# Patient Record
Sex: Male | Born: 1990 | Race: White | Hispanic: No | Marital: Single | Smoking: Current every day smoker
Health system: Southern US, Community
[De-identification: ages and names within clinical notes are randomized; demographics above are authoritative.]

## PROBLEM LIST (undated history)

## (undated) ENCOUNTER — Emergency Department (HOSPITAL_COMMUNITY): Admission: EM | Payer: BLUE CROSS/BLUE SHIELD | Source: Home / Self Care

## (undated) DIAGNOSIS — K5900 Constipation, unspecified: Secondary | ICD-10-CM

## (undated) DIAGNOSIS — F988 Other specified behavioral and emotional disorders with onset usually occurring in childhood and adolescence: Secondary | ICD-10-CM

## (undated) DIAGNOSIS — K219 Gastro-esophageal reflux disease without esophagitis: Secondary | ICD-10-CM

## (undated) HISTORY — PX: TYMPANOSTOMY TUBE PLACEMENT: SHX32

## (undated) HISTORY — PX: HAND SURGERY: SHX662

## (undated) HISTORY — DX: Constipation, unspecified: K59.00

---
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
## 2012-05-19 IMAGING — CT CT ABD-PELV W/ CM
1 of 2 series · 15 of 32 positions shown, 19 images · IV contrast (APPLIED)
Comparison: Plain radiographs 09/15/2011

CLINICAL DATA: Umbilical abdominal pain with diarrhea.

CT ABDOMEN AND PELVIS WITH CONTRAST
TECHNIQUE: Multidetector CT imaging of the abdomen and pelvis was
performed following the standard protocol during bolus
administration of intravenous contrast.
Contrast: 100mL OMNIPAQUE IOHEXOL 300 MG/ML IV SOLN

[Series 2: abd/pel with · axial · 0.77mm/px · z∈[-404,+6]mm · 15 of 90 slices shown, 19 images]
[im 4/90  soft-tissue]
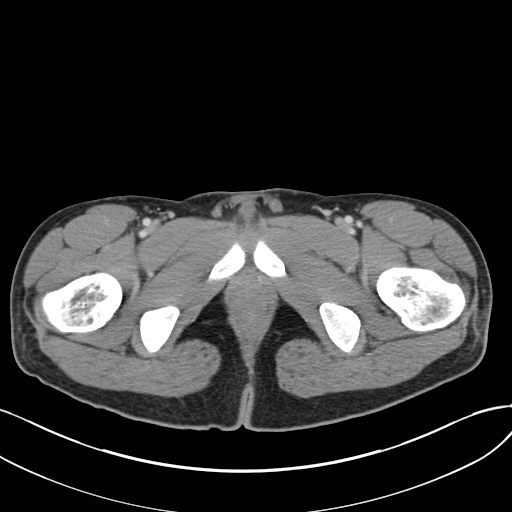
[im 4/90  bone]
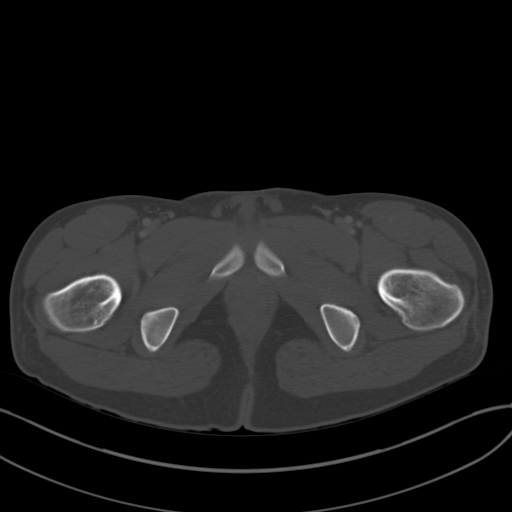
[im 12/90  soft-tissue]
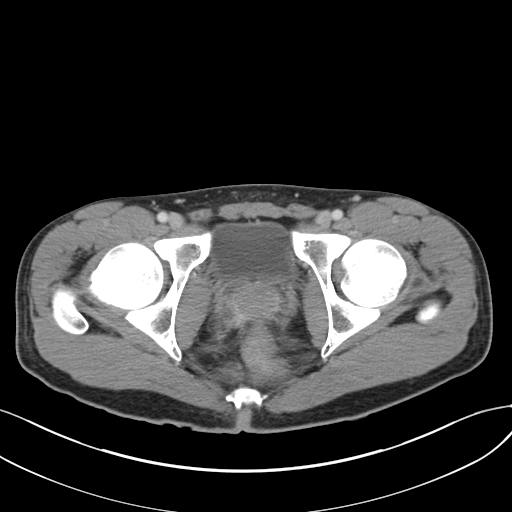
[im 20/90  soft-tissue]
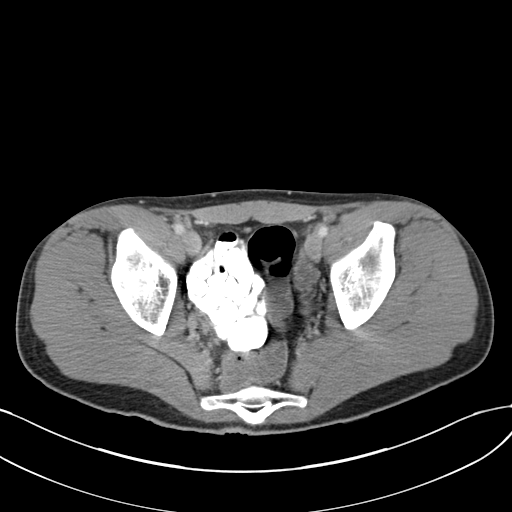
[im 24/90  soft-tissue]
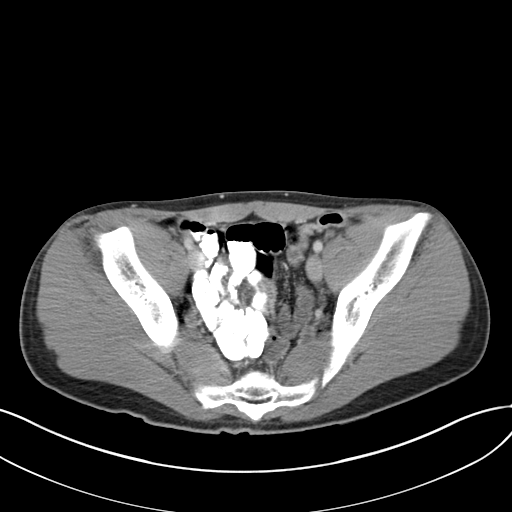
[im 31/90  soft-tissue]
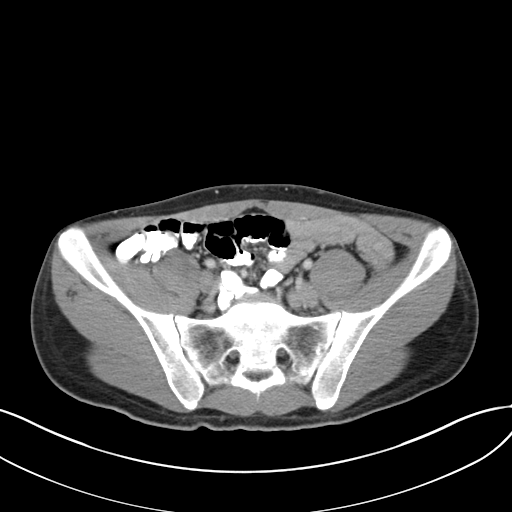
[im 39/90  soft-tissue]
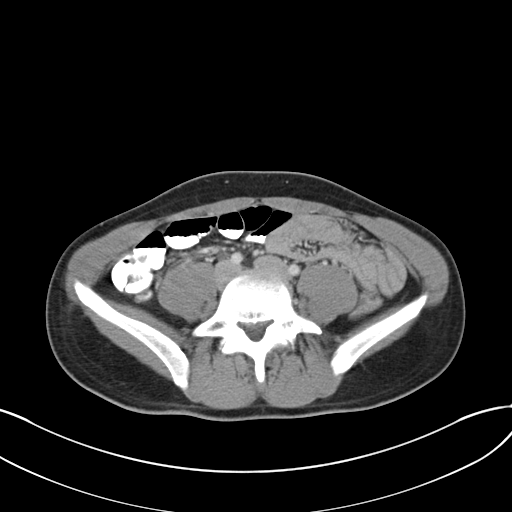
[im 47/90  soft-tissue]
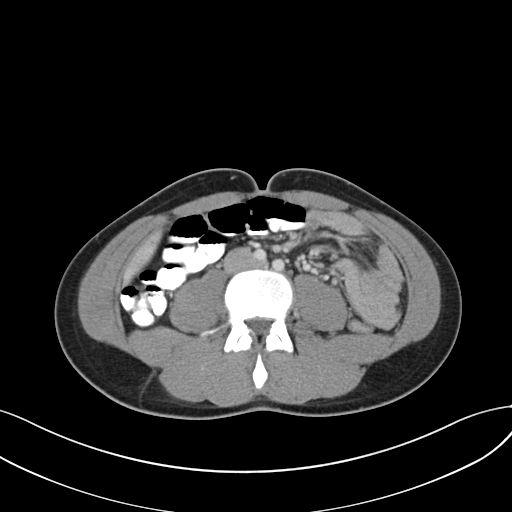
[im 51/90  soft-tissue]
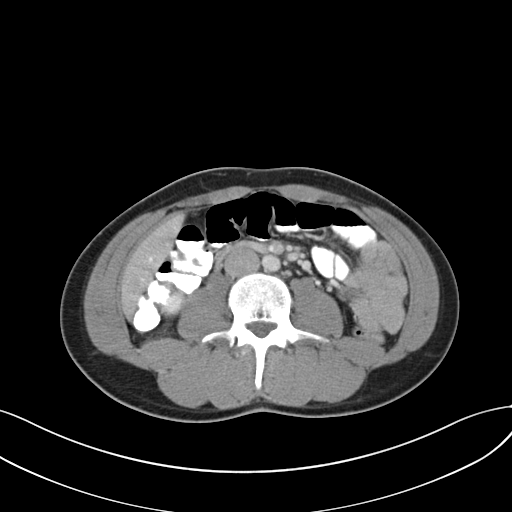
[im 59/90  soft-tissue]
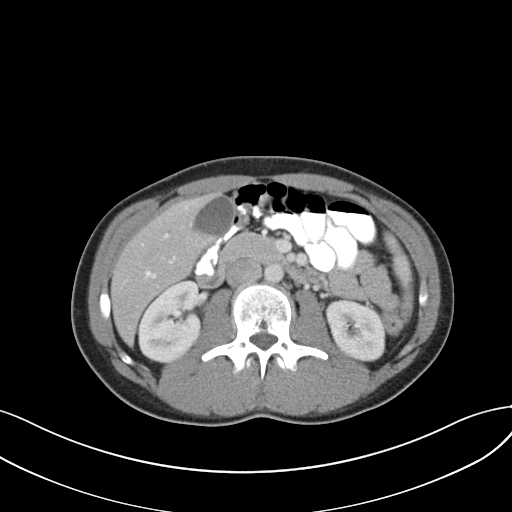
[im 59/90  bone]
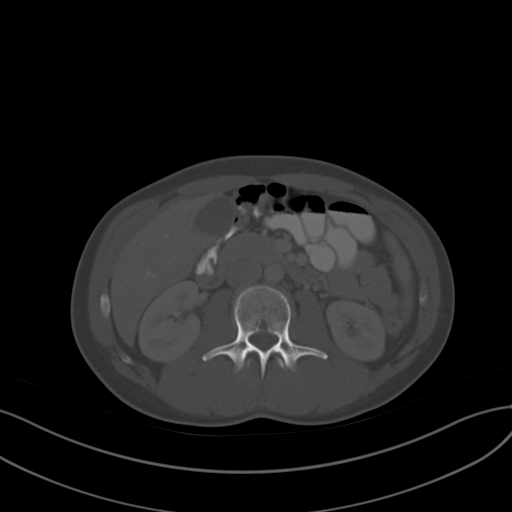
[im 66/90  soft-tissue]
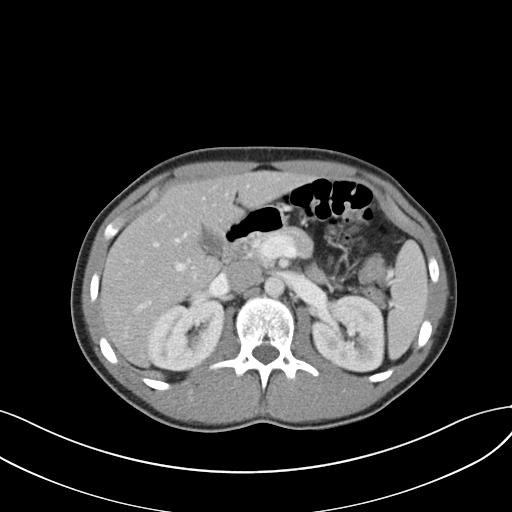
[im 70/90  soft-tissue]
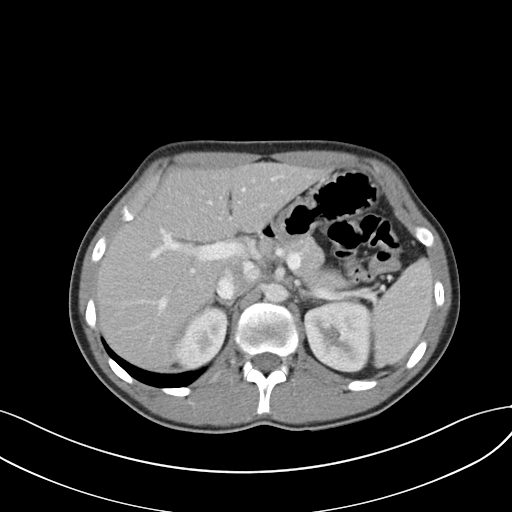
[im 74/90  lung]
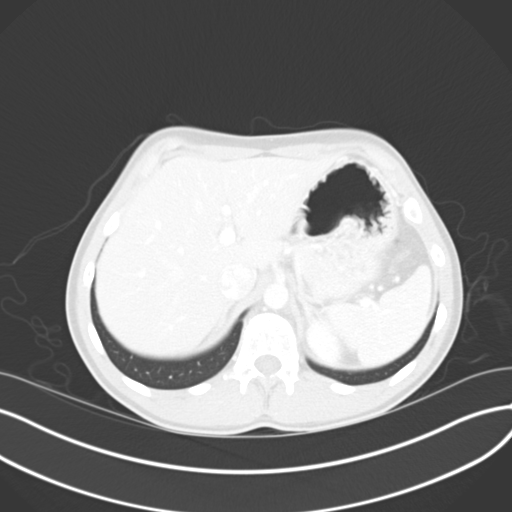
[im 78/90  soft-tissue]
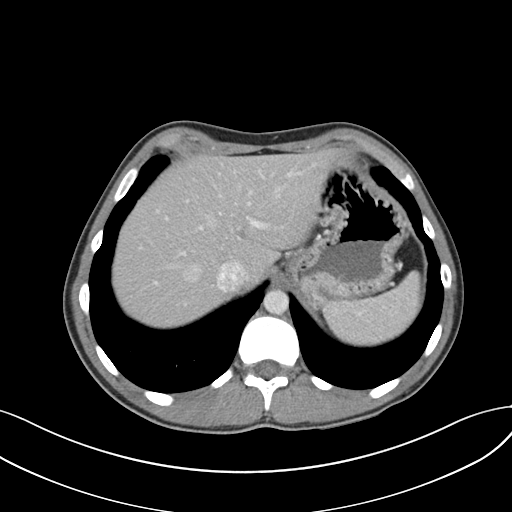
[im 78/90  lung]
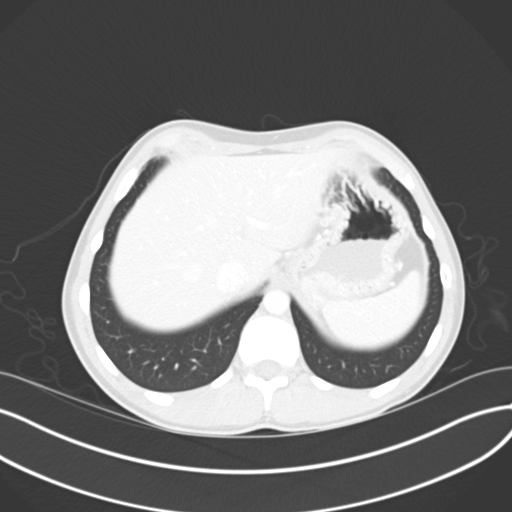
[im 82/90  lung]
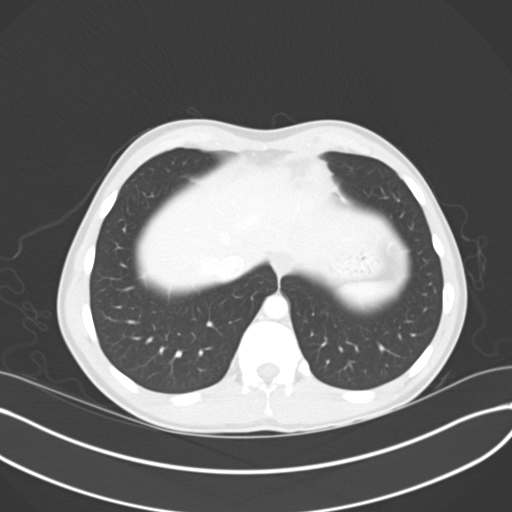
[im 86/90  soft-tissue]
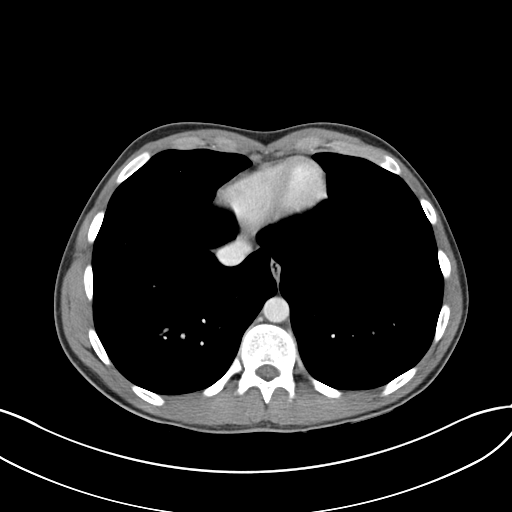
[im 86/90  lung]
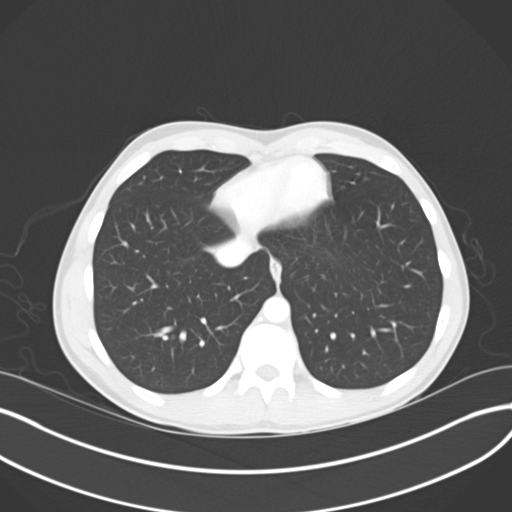

[15 of 32 positions shown; findings below may reference images not displayed]

FINDINGS: The lung bases are clear.  The liver, spleen,
gallbladder, pancreas, adrenal glands, kidneys, abdominal aorta,
and retroperitoneal lymph nodes are unremarkable.  The stomach is
moderately well distended with gas and fluid.  There is a
suggestion of gastric fold thickening which might represent
inflammatory process or under distension changes.  Gastritis is not
excluded.  Small bowel are not distended.  Contrast material
extends to the colon suggesting no evidence of obstruction.  No
free fluid or free air in the abdomen.  The abdominal wall muscles
appear intact.  No hernia is visualized.

Pelvis:  No free or loculated pelvic fluid collections.  No
significant pelvic lymphadenopathy.  The bladder wall is not
thickened.  No inflammatory changes suggested in the sigmoid colon.
The appendix is normal.  Normal alignment of the lumbar vertebrae.
IMPRESSION: Nonspecific prominence of gastric folds which could represent
gastritis or under distension artifact.  No other changes which
might suggest possible acute process in the abdomen or pelvis.

## 2012-05-25 ENCOUNTER — Encounter: Payer: Self-pay | Admitting: Family Medicine

## 2012-05-30 DIAGNOSIS — K5900 Constipation, unspecified: Secondary | ICD-10-CM

## 2012-05-30 HISTORY — DX: Constipation, unspecified: K59.00

## 2012-07-12 ENCOUNTER — Encounter: Payer: Self-pay | Admitting: Family Medicine

## 2013-01-27 ENCOUNTER — Emergency Department (HOSPITAL_COMMUNITY)
Admission: EM | Admit: 2013-01-27 | Discharge: 2013-01-28 | Disposition: A | Discharge status: Home / Self Care | Payer: BC Managed Care – PPO | Attending: Emergency Medicine | Admitting: Emergency Medicine

## 2013-01-27 ENCOUNTER — Emergency Department (HOSPITAL_COMMUNITY): Payer: BC Managed Care – PPO

## 2013-01-27 DIAGNOSIS — S0010XA Contusion of unspecified eyelid and periocular area, initial encounter: Secondary | ICD-10-CM | POA: Insufficient documentation

## 2013-01-27 DIAGNOSIS — Y9389 Activity, other specified: Secondary | ICD-10-CM | POA: Insufficient documentation

## 2013-01-27 DIAGNOSIS — Z79899 Other long term (current) drug therapy: Secondary | ICD-10-CM | POA: Insufficient documentation

## 2013-01-27 DIAGNOSIS — S01409A Unspecified open wound of unspecified cheek and temporomandibular area, initial encounter: Secondary | ICD-10-CM | POA: Insufficient documentation

## 2013-01-27 DIAGNOSIS — S0512XA Contusion of eyeball and orbital tissues, left eye, initial encounter: Secondary | ICD-10-CM

## 2013-01-27 DIAGNOSIS — IMO0002 Reserved for concepts with insufficient information to code with codable children: Secondary | ICD-10-CM | POA: Insufficient documentation

## 2013-01-27 DIAGNOSIS — H05232 Hemorrhage of left orbit: Secondary | ICD-10-CM

## 2013-01-27 DIAGNOSIS — R229 Localized swelling, mass and lump, unspecified: Secondary | ICD-10-CM | POA: Insufficient documentation

## 2013-01-27 DIAGNOSIS — F17200 Nicotine dependence, unspecified, uncomplicated: Secondary | ICD-10-CM | POA: Insufficient documentation

## 2013-01-27 DIAGNOSIS — Y929 Unspecified place or not applicable: Secondary | ICD-10-CM | POA: Insufficient documentation
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
## 2013-01-28 MED ORDER — HYDROCODONE-ACETAMINOPHEN 5-325 MG PO TABS: 1.0000 | ORAL_TABLET | Freq: Four times a day (QID) | ORAL | Status: DC | PRN

## 2013-01-28 NOTE — ED Provider Notes (Signed)
Medical screening examination/treatment/procedure(s) were performed by non-physician practitioner and as supervising physician I was immediately available for consultation/collaboration.  Ethelda Chick, MD 01/28/13 [phone]

## 2014-02-11 ENCOUNTER — Encounter (HOSPITAL_COMMUNITY): Payer: Self-pay | Admitting: Emergency Medicine

## 2014-02-11 ENCOUNTER — Emergency Department (HOSPITAL_COMMUNITY)
Admission: EM | Admit: 2014-02-11 | Discharge: 2014-02-11 | Disposition: A | Discharge status: Home / Self Care | Payer: BC Managed Care – PPO | Attending: Emergency Medicine | Admitting: Emergency Medicine

## 2014-02-11 DIAGNOSIS — Y9389 Activity, other specified: Secondary | ICD-10-CM | POA: Insufficient documentation

## 2014-02-11 DIAGNOSIS — Z8719 Personal history of other diseases of the digestive system: Secondary | ICD-10-CM | POA: Insufficient documentation

## 2014-02-11 DIAGNOSIS — S93409A Sprain of unspecified ligament of unspecified ankle, initial encounter: Secondary | ICD-10-CM | POA: Insufficient documentation

## 2014-02-11 DIAGNOSIS — F17200 Nicotine dependence, unspecified, uncomplicated: Secondary | ICD-10-CM | POA: Insufficient documentation

## 2014-02-11 DIAGNOSIS — X500XXA Overexertion from strenuous movement or load, initial encounter: Secondary | ICD-10-CM | POA: Insufficient documentation

## 2014-02-11 DIAGNOSIS — Y929 Unspecified place or not applicable: Secondary | ICD-10-CM | POA: Insufficient documentation

## 2014-02-11 NOTE — ED Notes (Signed)
Pt states that he twisted his ankle on Saturday c/o rt ankle pain.  States it is getting better "but I really just need a work note".

## 2014-02-11 NOTE — ED Provider Notes (Signed)
CSN: [id number]     Arrival date & time 02/11/14  0932 History   First MD Initiated Contact with Patient 02/11/14 1013     Chief Complaint  Patient presents with  . Ankle Pain     (Consider location/radiation/quality/duration/timing/severity/associated sxs/prior Treatment) Patient is a 23 y.o. male presenting with ankle pain. The history is provided by the patient. No language interpreter was used.  Ankle Pain Location:  Ankle Ankle location:  R ankle Associated symptoms: no fever   Associated symptoms comment:  Twisting ankle injury that occurred 2 days ago. He reports improvement in discomfort, less swelling. He wants to return to work today and wanted the ankle checked to make sure it was not going to cause further problems.    Past Medical History  Diagnosis Date  . Constipation 05/30/12    [facility]; Donnatal q.i.d. Follow up in 4 weeks.   No past surgical history on file. No family history on file. History  Substance Use Topics  . Smoking status: Current Every Day Smoker    Types: Cigarettes  . Smokeless tobacco: Not on file  . Alcohol Use: Yes    Review of Systems  Constitutional: Negative for fever and chills.  Musculoskeletal: Positive for arthralgias.       See HPI  Skin: Negative.  Negative for wound.  Neurological: Negative.  Negative for numbness.      Allergies  No known allergies  Home Medications   Prior to Admission medications   Not on File   There were no vitals taken for this visit. Physical Exam  Constitutional: He appears well-developed and well-nourished. No distress.  Pulmonary/Chest: Effort normal.  Musculoskeletal:  Right foot and ankle without swelling, discoloration or bony deformity or tenderness. Ankle joint stable.     ED Course  Procedures (including critical care time) Labs Review Labs Reviewed - No data to display  Imaging Review No results found.   EKG Interpretation None      MDM    Final diagnoses:  None    1. Mild ankle sprain, improving  Ok to return to work. ASO provided.     Arnoldo HookerShari A Addie Cederberg, PA-C 02/11/14 1032

## 2014-02-11 NOTE — Discharge Instructions (Signed)
Ankle Sprain °An ankle sprain is an injury to the strong, fibrous tissues (ligaments) that hold the bones of your ankle joint together.  °CAUSES °An ankle sprain is usually caused by a fall or by twisting your ankle. Ankle sprains most commonly occur when you step on the outer edge of your foot, and your ankle turns inward. People who participate in sports are more prone to these types of injuries.  °SYMPTOMS  °· Pain in your ankle. The pain may be present at rest or only when you are trying to stand or walk. °· Swelling. °· Bruising. Bruising may develop immediately or within 1 to 2 days after your injury. °· Difficulty standing or walking, particularly when turning corners or changing directions. °DIAGNOSIS  °Your caregiver will ask you details about your injury and perform a physical exam of your ankle to determine if you have an ankle sprain. During the physical exam, your caregiver will press on and apply pressure to specific areas of your foot and ankle. Your caregiver will try to move your ankle in certain ways. An X-ray exam may be done to be sure a bone was not broken or a ligament did not separate from one of the bones in your ankle (avulsion fracture).  °TREATMENT  °Certain types of braces can help stabilize your ankle. Your caregiver can make a recommendation for this. Your caregiver may recommend the use of medicine for pain. If your sprain is severe, your caregiver may refer you to a surgeon who helps to restore function to parts of your skeletal system (orthopedist) or a physical therapist. °HOME CARE INSTRUCTIONS  °· Apply ice to your injury for 1 2 days or as directed by your caregiver. Applying ice helps to reduce inflammation and pain. °· Put ice in a plastic bag. °· Place a towel between your skin and the bag. °· Leave the ice on for 15-20 minutes at a time, every 2 hours while you are awake. °· Only take over-the-counter or prescription medicines for pain, discomfort, or fever as directed by  your caregiver. °· Elevate your injured ankle above the level of your heart as much as possible for 2 3 days. °· If your caregiver recommends crutches, use them as instructed. Gradually put weight on the affected ankle. Continue to use crutches or a cane until you can walk without feeling pain in your ankle. °· If you have a plaster splint, wear the splint as directed by your caregiver. Do not rest it on anything harder than a pillow for the first 24 hours. Do not put weight on it. Do not get it wet. You may take it off to take a shower or bath. °· You may have been given an elastic bandage to wear around your ankle to provide support. If the elastic bandage is too tight (you have numbness or tingling in your foot or your foot becomes cold and blue), adjust the bandage to make it comfortable. °· If you have an air splint, you may blow more air into it or let air out to make it more comfortable. You may take your splint off at night and before taking a shower or bath. Wiggle your toes in the splint several times per day to decrease swelling. °SEEK MEDICAL CARE IF:  °· You have rapidly increasing bruising or swelling. °· Your toes feel extremely cold or you lose feeling in your foot. °· Your pain is not relieved with medicine. °SEEK IMMEDIATE MEDICAL CARE IF: °· Your toes are numb   or blue. °· You have severe pain that is increasing. °MAKE SURE YOU:  °· Understand these instructions. °· Will watch your condition. °· Will get help right away if you are not doing well or get worse. °Document Released: 09/19/2005 Document Revised: 06/13/2012 Document Reviewed: 10/01/2011 °ExitCare® Patient Information ©2014 [employer]. ° °Cryotherapy °Cryotherapy means treatment with cold. Ice or gel packs can be used to reduce both pain and swelling. Ice is the most helpful within the first 24 to 48 hours after an injury or flareup from overusing a muscle or joint. Sprains, strains, spasms, burning pain, shooting pain, and aches can  all be eased with ice. Ice can also be used when recovering from surgery. Ice is effective, has very few side effects, and is safe for most people to use. °PRECAUTIONS  °Ice is not a safe treatment option for people with: °· Raynaud's phenomenon. This is a condition affecting small blood vessels in the extremities. Exposure to cold may cause your problems to return. °· Cold hypersensitivity. There are many forms of cold hypersensitivity, including: °· Cold urticaria. Red, itchy hives appear on the skin when the tissues begin to warm after being iced. °· Cold erythema. This is a red, itchy rash caused by exposure to cold. °· Cold hemoglobinuria. Red blood cells break down when the tissues begin to warm after being iced. The hemoglobin that carry oxygen are passed into the urine because they cannot combine with blood proteins fast enough. °· Numbness or altered sensitivity in the area being iced. °If you have any of the following conditions, do not use ice until you have discussed cryotherapy with your caregiver: °· Heart conditions, such as arrhythmia, angina, or chronic heart disease. °· High blood pressure. °· Healing wounds or open skin in the area being iced. °· Current infections. °· Rheumatoid arthritis. °· Poor circulation. °· Diabetes. °Ice slows the blood flow in the region it is applied. This is beneficial when trying to stop inflamed tissues from spreading irritating chemicals to surrounding tissues. However, if you expose your skin to cold temperatures for too long or without the proper protection, you can damage your skin or nerves. Watch for signs of skin damage due to cold. °HOME CARE INSTRUCTIONS °Follow these tips to use ice and cold packs safely. °· Place a dry or damp towel between the ice and skin. A damp towel will cool the skin more quickly, so you may need to shorten the time that the ice is used. °· For a more rapid response, add gentle compression to the ice. °· Ice for no more than 10 to 20  minutes at a time. The bonier the area you are icing, the less time it will take to get the benefits of ice. °· Check your skin after 5 minutes to make sure there are no signs of a poor response to cold or skin damage. °· Rest 20 minutes or more in between uses. °· Once your skin is numb, you can end your treatment. You can test numbness by very lightly touching your skin. The touch should be so light that you do not see the skin dimple from the pressure of your fingertip. When using ice, most people will feel these normal sensations in this order: cold, burning, aching, and numbness. °· Do not use ice on someone who cannot communicate their responses to pain, such as small children or people with dementia. °HOW TO MAKE AN ICE PACK °Ice packs are the most common way to use ice   therapy. Other methods include ice massage, ice baths, and cryo-sprays. Muscle creams that cause a cold, tingly feeling do not offer the same benefits that ice offers and should not be used as a substitute unless recommended by your caregiver. °To make an ice pack, do one of the following: °· Place crushed ice or a bag of frozen vegetables in a sealable plastic bag. Squeeze out the excess air. Place this bag inside another plastic bag. Slide the bag into a pillowcase or place a damp towel between your skin and the bag. °· Mix 3 parts water with 1 part rubbing alcohol. Freeze the mixture in a sealable plastic bag. When you remove the mixture from the freezer, it will be slushy. Squeeze out the excess air. Place this bag inside another plastic bag. Slide the bag into a pillowcase or place a damp towel between your skin and the bag. °SEEK MEDICAL CARE IF: °· You develop white spots on your skin. This may give the skin a blotchy (mottled) appearance. °· Your skin turns blue or pale. °· Your skin becomes waxy or hard. °· Your swelling gets worse. °MAKE SURE YOU:  °· Understand these instructions. °· Will watch your condition. °· Will get help right  away if you are not doing well or get worse. °Document Released: 05/16/2011 Document Revised: 12/12/2011 Document Reviewed: 05/16/2011 °ExitCare® Patient Information ©2014 [employer]. ° °

## 2014-02-14 NOTE — ED Provider Notes (Signed)
Medical screening examination/treatment/procedure(s) were performed by non-physician practitioner and as supervising physician I was immediately available for consultation/collaboration.   EKG Interpretation None        Gwyneth SproutWhitney Jethro Radke, MD 02/14/14 1555

## 2014-04-12 ENCOUNTER — Emergency Department (HOSPITAL_BASED_OUTPATIENT_CLINIC_OR_DEPARTMENT_OTHER)
Admission: EM | Admit: 2014-04-12 | Discharge: 2014-04-12 | Disposition: A | Discharge status: Home / Self Care | Payer: BC Managed Care – PPO | Attending: Emergency Medicine | Admitting: Emergency Medicine

## 2014-04-12 ENCOUNTER — Encounter (HOSPITAL_BASED_OUTPATIENT_CLINIC_OR_DEPARTMENT_OTHER): Payer: Self-pay | Admitting: Emergency Medicine

## 2014-04-12 DIAGNOSIS — Y9389 Activity, other specified: Secondary | ICD-10-CM | POA: Insufficient documentation

## 2014-04-12 DIAGNOSIS — F17200 Nicotine dependence, unspecified, uncomplicated: Secondary | ICD-10-CM | POA: Insufficient documentation

## 2014-04-12 DIAGNOSIS — Y92410 Unspecified street and highway as the place of occurrence of the external cause: Secondary | ICD-10-CM | POA: Insufficient documentation

## 2014-04-12 DIAGNOSIS — S0990XA Unspecified injury of head, initial encounter: Secondary | ICD-10-CM | POA: Insufficient documentation

## 2014-04-12 HISTORY — DX: Gastro-esophageal reflux disease without esophagitis: K21.9
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
## 2014-04-12 NOTE — ED Notes (Signed)
Pt excessively talking and friendly.

## 2015-06-26 DIAGNOSIS — Z720 Tobacco use: Secondary | ICD-10-CM | POA: Diagnosis not present

## 2015-06-26 DIAGNOSIS — L03116 Cellulitis of left lower limb: Secondary | ICD-10-CM | POA: Insufficient documentation

## 2015-06-26 DIAGNOSIS — L02416 Cutaneous abscess of left lower limb: Secondary | ICD-10-CM | POA: Diagnosis not present

## 2015-06-26 DIAGNOSIS — Z8719 Personal history of other diseases of the digestive system: Secondary | ICD-10-CM | POA: Insufficient documentation

## 2015-06-27 ENCOUNTER — Emergency Department (HOSPITAL_COMMUNITY)
Admission: EM | Admit: 2015-06-27 | Discharge: 2015-06-27 | Disposition: A | Discharge status: Home / Self Care | Payer: BLUE CROSS/BLUE SHIELD | Attending: Emergency Medicine | Admitting: Emergency Medicine

## 2015-06-27 ENCOUNTER — Encounter (HOSPITAL_COMMUNITY): Payer: Self-pay | Admitting: *Deleted

## 2015-06-27 DIAGNOSIS — L0291 Cutaneous abscess, unspecified: Secondary | ICD-10-CM

## 2015-06-27 DIAGNOSIS — L0390 Cellulitis, unspecified: Secondary | ICD-10-CM

## 2015-06-27 MED ORDER — CEPHALEXIN 250 MG PO CAPS
250.0000 mg | ORAL_CAPSULE | Freq: Once | ORAL | Status: AC
  Administered 2015-06-27: 250 mg via ORAL
  Filled 2015-06-27: qty 1

## 2015-06-27 MED ORDER — CEPHALEXIN 500 MG PO CAPS: 500.0000 mg | ORAL_CAPSULE | Freq: Four times a day (QID) | ORAL | Status: DC

## 2015-06-27 NOTE — ED Provider Notes (Signed)
CSN: [id number]     Arrival date & time 06/26/15  2359 History   First MD Initiated Contact with Patient 06/27/15 0129     Chief Complaint  Patient presents with  . Abscess     (Consider location/radiation/quality/duration/timing/severity/associated sxs/prior Treatment) Patient is a 24 y.o. male presenting with abscess. The history is provided by the patient. No language interpreter was used.  Abscess Location:  Torso Torso abscess location: left hip. Size:  3x5cm Abscess quality: painful and redness   Red streaking: no   Duration:  2 days Progression:  Worsening Pain details:    Quality:  Throbbing   Severity:  Moderate   Duration:  2 days   Timing:  Constant   Progression:  Worsening Chronicity:  New Context: not diabetes, not immunosuppression, not injected drug use, not insect bite/sting and not skin injury   Relieved by:  Nothing Worsened by:  Draining/squeezing Ineffective treatments:  None tried Associated symptoms: no anorexia, no fatigue, no fever, no headaches, no nausea and no vomiting   Risk factors: no family hx of MRSA, no hx of MRSA and no prior abscess     Past Medical History  Diagnosis Date  . Constipation 05/30/12    [facility], [facility]; Donnatal q.i.d. Follow up in 4 weeks.  Marland Kitchen GERD (gastroesophageal reflux disease)    Past Surgical History  Procedure Laterality Date  . Tympanostomy tube placement    . Hand surgery      for fracture   No family history on file. Social History  Substance Use Topics  . Smoking status: Current Every Day Smoker -- 0.50 packs/day    Types: Cigarettes  . Smokeless tobacco: None  . Alcohol Use: Yes     Comment: every day    Review of Systems  Constitutional: Negative for fever and fatigue.  Gastrointestinal: Negative for nausea, vomiting and anorexia.  Skin: Positive for wound.  Neurological: Negative for headaches.  All other systems reviewed and are negative.     Allergies  No known  allergies  Home Medications   Prior to Admission medications   Not on File   BP 137/85 mmHg  Pulse 97  Temp(Src) 98.8 F (37.1 C) (Oral)  Resp 20  SpO2 100% Physical Exam  Constitutional: He is oriented to person, place, and time. He appears well-developed and well-nourished. No distress.  HENT:  Head: Normocephalic and atraumatic.  Eyes: Conjunctivae and EOM are normal.  Neck: Normal range of motion.  Cardiovascular: Normal rate and regular rhythm.  Exam reveals no gallop and no friction rub.   No murmur heard. Pulmonary/Chest: Effort normal and breath sounds normal. He has no wheezes. He has no rales. He exhibits no tenderness.  Abdominal: Soft. He exhibits no distension. There is no tenderness. There is no rebound.  Musculoskeletal: Normal range of motion.  Neurological: He is alert and oriented to person, place, and time. Coordination normal.  Speech is goal-oriented. Moves limbs without ataxia.   Skin: Skin is warm and dry.  5x3cm area of erythema, edema and tenderness to palpation of the left anterior hip. No drainage noted.   Psychiatric: He has a normal mood and affect. His behavior is normal.  Nursing note and vitals reviewed.   ED Course  Procedures (including critical care time)  INCISION AND DRAINAGE Performed by: Emilia Beck Consent: Verbal consent obtained. Risks and benefits: risks, benefits and alternatives were discussed Type: abscess  Body area: left anterior hip  Anesthesia: local infiltration  Incision was  made with a scalpel.  Local anesthetic: lidocaine 1% with epinephrine  Anesthetic total: 1 ml  Complexity: complex Blunt dissection to break up loculations  Drainage: purulent  Drainage amount: small amount  Patient tolerance: Patient tolerated the procedure well with no immediate complications.     Labs Review Labs Reviewed - No data to display  Imaging Review No results found. I have personally reviewed and evaluated  these images and lab results as part of my medical decision-making.   EKG Interpretation None      MDM   Final diagnoses:  Abscess and cellulitis   2:11 AM Patient's abscess drained. Patient will have keflex for surrounding cellulitis. Vitals stable and patient afebrile.    [address] [place], PA-C 06/27/15 1610  Raeford Razor, MD 06/29/15 1414

## 2015-06-27 NOTE — Discharge Instructions (Signed)
Take Keflex as directed until gone. Refer to attached documents for more information. Return to the ED with worsening or concerning symptoms.  °

## 2015-06-27 NOTE — ED Notes (Signed)
**Note De-Identified  Obfuscation** Pt states that he has an abscess to his left hip; pt states that it started 2 days ago and just has gotten worse; pt reports that it is red and swollen

## 2015-06-28 MED ORDER — CEPHALEXIN 500 MG PO CAPS: 500.0000 mg | ORAL_CAPSULE | Freq: Four times a day (QID) | ORAL | Status: DC

## 2016-05-08 ENCOUNTER — Encounter (HOSPITAL_COMMUNITY): Payer: Self-pay | Admitting: Oncology

## 2016-05-08 ENCOUNTER — Emergency Department (HOSPITAL_COMMUNITY)
Admission: EM | Admit: 2016-05-08 | Discharge: 2016-05-08 | Disposition: A | Discharge status: Home / Self Care | Payer: BLUE CROSS/BLUE SHIELD | Attending: Emergency Medicine | Admitting: Emergency Medicine

## 2016-05-08 DIAGNOSIS — F17210 Nicotine dependence, cigarettes, uncomplicated: Secondary | ICD-10-CM | POA: Diagnosis not present

## 2016-05-08 DIAGNOSIS — Z23 Encounter for immunization: Secondary | ICD-10-CM | POA: Diagnosis not present

## 2016-05-08 DIAGNOSIS — S0101XA Laceration without foreign body of scalp, initial encounter: Secondary | ICD-10-CM | POA: Insufficient documentation

## 2016-05-08 DIAGNOSIS — Y999 Unspecified external cause status: Secondary | ICD-10-CM | POA: Insufficient documentation

## 2016-05-08 DIAGNOSIS — S0121XA Laceration without foreign body of nose, initial encounter: Secondary | ICD-10-CM | POA: Insufficient documentation

## 2016-05-08 DIAGNOSIS — Y929 Unspecified place or not applicable: Secondary | ICD-10-CM | POA: Insufficient documentation

## 2016-05-08 DIAGNOSIS — Y939 Activity, unspecified: Secondary | ICD-10-CM | POA: Insufficient documentation

## 2016-05-08 DIAGNOSIS — S0181XA Laceration without foreign body of other part of head, initial encounter: Secondary | ICD-10-CM

## 2016-05-08 MED ORDER — TETANUS-DIPHTH-ACELL PERTUSSIS 5-2.5-18.5 LF-MCG/0.5 IM SUSP
0.5000 mL | Freq: Once | INTRAMUSCULAR | Status: AC
  Administered 2016-05-08: 0.5 mL via INTRAMUSCULAR
  Filled 2016-05-08: qty 0.5

## 2016-05-08 MED ORDER — OXYCODONE-ACETAMINOPHEN 5-325 MG PO TABS
1.0000 | ORAL_TABLET | Freq: Once | ORAL | Status: AC
  Administered 2016-05-08: 1 via ORAL
  Filled 2016-05-08: qty 1

## 2016-05-08 NOTE — Discharge Instructions (Signed)
Staples need to be removed in 10 days. Keep the steri strips on until they peel off themselves. Avoid petroleum products. To avoid scarring, cover the facial laceration from the sun.
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
## 2016-05-21 ENCOUNTER — Emergency Department (HOSPITAL_COMMUNITY)
Admission: EM | Admit: 2016-05-21 | Discharge: 2016-05-21 | Disposition: A | Discharge status: Home / Self Care | Payer: BLUE CROSS/BLUE SHIELD | Attending: Emergency Medicine | Admitting: Emergency Medicine

## 2016-05-21 ENCOUNTER — Encounter (HOSPITAL_COMMUNITY): Payer: Self-pay | Admitting: Emergency Medicine

## 2016-05-21 DIAGNOSIS — F1490 Cocaine use, unspecified, uncomplicated: Secondary | ICD-10-CM | POA: Diagnosis not present

## 2016-05-21 DIAGNOSIS — F17210 Nicotine dependence, cigarettes, uncomplicated: Secondary | ICD-10-CM | POA: Insufficient documentation

## 2016-05-21 DIAGNOSIS — Z4802 Encounter for removal of sutures: Secondary | ICD-10-CM

## 2016-05-21 DIAGNOSIS — H6002 Abscess of left external ear: Secondary | ICD-10-CM | POA: Diagnosis not present

## 2016-05-21 DIAGNOSIS — F1290 Cannabis use, unspecified, uncomplicated: Secondary | ICD-10-CM | POA: Insufficient documentation

## 2016-05-21 DIAGNOSIS — L0291 Cutaneous abscess, unspecified: Secondary | ICD-10-CM

## 2016-05-21 MED ORDER — LIDOCAINE HCL 1 % IJ SOLN
INTRAMUSCULAR | Status: AC
  Filled 2016-05-21: qty 20

## 2016-05-21 MED ORDER — LIDOCAINE HCL 1 % IJ SOLN: 5.0000 mL | Freq: Once | INTRAMUSCULAR | Status: DC

## 2016-05-21 MED ORDER — SULFAMETHOXAZOLE-TRIMETHOPRIM 800-160 MG PO TABS: 1.0000 | ORAL_TABLET | Freq: Two times a day (BID) | ORAL | 0 refills | Status: AC
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
## 2016-05-21 NOTE — ED Triage Notes (Signed)
Pt here for staple removal of 2 staples to top of head. He also states he has an area behind his L ear that is painful. He thinks it may be an abscess. Rates pain 5/10.

## 2016-07-26 ENCOUNTER — Emergency Department (HOSPITAL_COMMUNITY)
Admission: EM | Admit: 2016-07-26 | Discharge: 2016-07-26 | Disposition: A | Discharge status: Home / Self Care | Payer: BLUE CROSS/BLUE SHIELD | Attending: Emergency Medicine | Admitting: Emergency Medicine

## 2016-07-26 ENCOUNTER — Encounter (HOSPITAL_COMMUNITY): Payer: Self-pay

## 2016-07-26 DIAGNOSIS — F17210 Nicotine dependence, cigarettes, uncomplicated: Secondary | ICD-10-CM | POA: Insufficient documentation

## 2016-07-26 DIAGNOSIS — M545 Low back pain, unspecified: Secondary | ICD-10-CM

## 2016-07-26 DIAGNOSIS — F909 Attention-deficit hyperactivity disorder, unspecified type: Secondary | ICD-10-CM | POA: Diagnosis not present

## 2016-07-26 HISTORY — DX: Other specified behavioral and emotional disorders with onset usually occurring in childhood and adolescence: F98.8

## 2016-07-26 MED ORDER — CYCLOBENZAPRINE HCL 10 MG PO TABS: 10.0000 mg | ORAL_TABLET | Freq: Two times a day (BID) | ORAL | 0 refills | Status: DC | PRN

## 2016-07-26 MED ORDER — NAPROXEN 500 MG PO TABS: 500.0000 mg | ORAL_TABLET | Freq: Two times a day (BID) | ORAL | 0 refills | Status: DC

## 2016-07-26 NOTE — Discharge Instructions (Signed)
Please read attached information. If you experience any new or worsening signs or symptoms please return to the emergency room for evaluation. Please follow-up with your primary care provider or specialist as discussed. Please use medication prescribed only as directed and discontinue taking if you have any concerning signs or symptoms.   °

## 2016-07-26 NOTE — ED Provider Notes (Signed)
[facility]ERGENC[facility] DEPT Provider Note   CSN: [id number] Arrival date & time: 07/26/16  1738  By signing my name below, I, Octavia Heirrianna Nassar, attest that this documentation has been prepared under the direction and in the presence of Newell RubbermaidJeffrey Verdelle Valtierra, PA-C.  Electronically Signed: [name], ED Scribe. 07/26/16. 6:16 PM.    History   Chief Complaint Chief Complaint  Patient presents with  . Back Pain   The history is provided by the patient. No language interpreter was used.   HPI Comments: William Allison is a 25 y.o. male who presents to the Emergency Department complaining of intermittent, gradual worsening, moderate lower back pain x 2.5 months. He rates his pain a 10/10. There is no injury or trauma noted but he says he has bad posture so that may possibly be a cause of his pain. He has not taken any pain medication to alleviate his pain but notes he has been smoking excess marijuana without relief. Pt expresses increased pain when bending over or tying his shoe. Denies fever, numbness/tingling in legs, hx of IV drug use, or bladder/bowel incontinence.  Past Medical History:  Diagnosis Date  . ADD (attention deficit disorder)   . Constipation 05/30/12   [facility]; Donnatal q.i.d. Follow up in 4 weeks.  Marland Kitchen. GERD (gastroesophageal reflux disease)     There are no active problems to display for this patient.   Past Surgical History:  Procedure Laterality Date  . HAND SURGERY     for fracture  . TYMPANOSTOMY TUBE PLACEMENT         Home Medications    Prior to Admission medications   Medication Sig Start Date End Date Taking? Authorizing Provider  cyclobenzaprine (FLEXERIL) 10 MG tablet Take 1 tablet (10 mg total) by mouth 2 (two) times daily as needed for muscle spasms. 07/26/16   Eyvonne MechanicJeffrey Yan Okray, PA-C  naproxen (NAPROSYN) 500 MG tablet Take 1 tablet (500 mg total) by mouth 2 (two) times daily. 07/26/16   Eyvonne MechanicJeffrey Kaiyah Eber, PA-C    Family  History History reviewed. No pertinent family history.  Social History Social History  Substance Use Topics  . Smoking status: Current Every Day Smoker    Packs/day: 0.50    Types: Cigarettes  . Smokeless tobacco: Never Used  . Alcohol use Yes     Comment: every day     Allergies   Review of patient's allergies indicates no known allergies.   Review of Systems Review of Systems  Constitutional: Negative for fever.  Musculoskeletal: Positive for back pain.  Neurological: Negative for weakness and numbness.  All other systems reviewed and are negative.    Physical Exam Updated Vital Signs BP (!) 122/102 (BP Location: Left Arm)   Pulse 75   Temp 97.9 F (36.6 C) (Oral)   Resp 16   SpO2 98%   Physical Exam  Constitutional: He is oriented to person, place, and time. He appears well-developed and well-nourished. No distress.  HENT:  Head: Normocephalic.  Neck: Normal range of motion. Neck supple.  Pulmonary/Chest: Effort normal.  Musculoskeletal: Normal range of motion. He exhibits tenderness. He exhibits no edema.  No C, T, or L spine tenderness to palpation. No obvious signs of trauma, deformity, infection, step-offs. Lung expansion normal. No scoliosis or kyphosis. Bilateral lower extremity strength 5 out of 5, sensation grossly intact. Refill less than 3 seconds.  Neurological: He is alert and oriented to person, place, and time.  Skin: Skin is warm and dry. He is  not diaphoretic.  Psychiatric: He has a normal mood and affect. His behavior is normal. Judgment and thought content normal.  Nursing note and vitals reviewed.    ED Treatments / Results  DIAGNOSTIC STUDIES: Oxygen Saturation is 98% on RA, normal by my interpretation.  COORDINATION OF CARE:  6:07 PM Discussed treatment plan with pt at bedside and pt agreed to plan.  Labs (all labs ordered are listed, but only abnormal results are displayed) Labs Reviewed - No data to display  EKG  EKG  Interpretation None       Radiology No results found.  Procedures Procedures (including critical care time)  Medications Ordered in ED Medications - No data to display   Initial Impression / Assessment and Plan / ED Course  I have reviewed the triage vital signs and the nursing notes.  Pertinent labs & imaging results that were available during my care of the patient were reviewed by me and considered in my medical decision making (see chart for details).  Clinical Course     Final Clinical Impressions(s) / ED Diagnoses   Final diagnoses:  Left-sided low back pain without sciatica, unspecified chronicity   Labs:  Imaging:  Consults:  Therapeutics:  Discharge Meds: Flexeril and Naproxen.  Assessment/Plan: 25 year old male presents today with back pain. Patient has a presentation consistent with muscular spasm. Patient has no red flags for back pain. Pt was advised to take prescriptions prn and follow up with PCP for further care. Pt also advised to use back exercises.    I personally performed the services described in this documentation, which was scribed in my presence. The recorded information has been reviewed and is accurate.   New Prescriptions Discharge Medication List as of 07/26/2016  6:43 PM    START taking these medications   Details  cyclobenzaprine (FLEXERIL) 10 MG tablet Take 1 tablet (10 mg total) by mouth 2 (two) times daily as needed for muscle spasms., Starting Tue 07/26/2016, Print         Eyvonne Mechanic, PA-C 07/26/16 2030    Raeford Razor, MD 07/27/16 [phone]

## 2016-07-26 NOTE — ED Notes (Signed)
Pt has atraumatic back pain.  Pt ambulatory.

## 2016-07-26 NOTE — ED Triage Notes (Signed)
Pt c/o increasing low back pain x "2-2.5 months."  Pain score 10/10.  Pt has not taken anything for symptoms.  Denies injury.  Sts "I remember bending over at work a couple months ago and felt a little pain, but nothing like this."  Pt reports bending chin toward chest "causes a sharp pain to shoot down spine."

## 2016-12-26 ENCOUNTER — Emergency Department (HOSPITAL_COMMUNITY): Payer: BLUE CROSS/BLUE SHIELD | Admitting: Anesthesiology

## 2016-12-26 ENCOUNTER — Encounter (HOSPITAL_COMMUNITY)
Admission: EM | Disposition: A | Discharge status: Home / Self Care | Payer: Self-pay | Source: Home / Self Care | Attending: Emergency Medicine

## 2016-12-26 ENCOUNTER — Emergency Department (HOSPITAL_COMMUNITY): Payer: BLUE CROSS/BLUE SHIELD

## 2016-12-26 ENCOUNTER — Observation Stay (HOSPITAL_COMMUNITY)
Admission: EM | Admit: 2016-12-26 | Discharge: 2016-12-27 | Disposition: A | Discharge status: Home / Self Care | Payer: BLUE CROSS/BLUE SHIELD | Attending: General Surgery | Admitting: General Surgery

## 2016-12-26 ENCOUNTER — Encounter (HOSPITAL_COMMUNITY): Payer: Self-pay | Admitting: Emergency Medicine

## 2016-12-26 DIAGNOSIS — K353 Acute appendicitis with localized peritonitis, without perforation or gangrene: Secondary | ICD-10-CM | POA: Diagnosis present

## 2016-12-26 DIAGNOSIS — F17210 Nicotine dependence, cigarettes, uncomplicated: Secondary | ICD-10-CM | POA: Insufficient documentation

## 2016-12-26 DIAGNOSIS — F988 Other specified behavioral and emotional disorders with onset usually occurring in childhood and adolescence: Secondary | ICD-10-CM | POA: Diagnosis not present

## 2016-12-26 DIAGNOSIS — K219 Gastro-esophageal reflux disease without esophagitis: Secondary | ICD-10-CM | POA: Insufficient documentation

## 2016-12-26 DIAGNOSIS — K3580 Unspecified acute appendicitis: Secondary | ICD-10-CM

## 2016-12-26 DIAGNOSIS — R109 Unspecified abdominal pain: Secondary | ICD-10-CM | POA: Diagnosis present

## 2016-12-26 HISTORY — PX: LAPAROSCOPIC APPENDECTOMY: SHX408

## 2016-12-26 LAB — COMPREHENSIVE METABOLIC PANEL
ALBUMIN: 4.8 g/dL (ref 3.5–5.0)
ALK PHOS: 90 U/L (ref 38–126)
ALT: 33 U/L (ref 17–63)
AST: 53 U/L — ABNORMAL HIGH (ref 15–41)
Anion gap: 10 (ref 5–15)
BILIRUBIN TOTAL: 1 mg/dL (ref 0.3–1.2)
BUN: 15 mg/dL (ref 6–20)
CALCIUM: 9.9 mg/dL (ref 8.9–10.3)
CO2: 28 mmol/L (ref 22–32)
Chloride: 101 mmol/L (ref 101–111)
Creatinine, Ser: 1.1 mg/dL (ref 0.61–1.24)
GFR calc non Af Amer: >60 mL/min (ref >60)
Glucose, Bld: 120 mg/dL — ABNORMAL HIGH (ref 65–99)
POTASSIUM: 3.8 mmol/L (ref 3.5–5.1)
Sodium: 139 mmol/L (ref 135–145)
TOTAL PROTEIN: 8.7 g/dL — AB (ref 6.5–8.1)

## 2016-12-26 LAB — CBC
HEMATOCRIT: 44.3 % (ref 39.0–52.0)
HEMOGLOBIN: 15.4 g/dL (ref 13.0–17.0)
MCH: 31.6 pg (ref 26.0–34.0)
MCHC: 34.8 g/dL (ref 30.0–36.0)
MCV: 91 fL (ref 78.0–100.0)
Platelets: 170 10*3/uL (ref 150–400)
RBC: 4.87 MIL/uL (ref 4.22–5.81)
RDW: 12.5 % (ref 11.5–15.5)
WBC: 9.6 10*3/uL (ref 4.0–10.5)

## 2016-12-26 LAB — URINALYSIS, ROUTINE W REFLEX MICROSCOPIC
BACTERIA UA: NONE SEEN
BILIRUBIN URINE: NEGATIVE
GLUCOSE, UA: NEGATIVE mg/dL
HGB URINE DIPSTICK: NEGATIVE
Ketones, ur: NEGATIVE mg/dL
LEUKOCYTES UA: NEGATIVE
NITRITE: NEGATIVE
PROTEIN: 100 mg/dL — AB
Specific Gravity, Urine: 1.032 — ABNORMAL HIGH (ref 1.005–1.030)
pH: 5 (ref 5.0–8.0)

## 2016-12-26 LAB — LIPASE, BLOOD: Lipase: 27 U/L (ref 11–51)

## 2016-12-26 SURGERY — APPENDECTOMY, LAPAROSCOPIC
Anesthesia: General | Site: Abdomen

## 2016-12-26 MED ORDER — ROCURONIUM BROMIDE 10 MG/ML (PF) SYRINGE
PREFILLED_SYRINGE | INTRAVENOUS | Status: DC | PRN
  Administered 2016-12-26: 10 mg via INTRAVENOUS
  Administered 2016-12-26: 25 mg via INTRAVENOUS

## 2016-12-26 MED ORDER — FENTANYL CITRATE (PF) 100 MCG/2ML IJ SOLN
INTRAMUSCULAR | Status: AC
  Filled 2016-12-26: qty 2

## 2016-12-26 MED ORDER — MIDAZOLAM HCL 2 MG/2ML IJ SOLN
INTRAMUSCULAR | Status: AC
  Filled 2016-12-26: qty 2

## 2016-12-26 MED ORDER — SUCCINYLCHOLINE CHLORIDE 200 MG/10ML IV SOSY
PREFILLED_SYRINGE | INTRAVENOUS | Status: DC | PRN
  Administered 2016-12-26: 140 mg via INTRAVENOUS

## 2016-12-26 MED ORDER — DEXAMETHASONE SODIUM PHOSPHATE 10 MG/ML IJ SOLN
INTRAMUSCULAR | Status: AC
  Filled 2016-12-26: qty 1

## 2016-12-26 MED ORDER — DEXTROSE 5 % IV SOLN
2.0000 g | Freq: Once | INTRAVENOUS | Status: AC
  Administered 2016-12-26: 2 g via INTRAVENOUS
  Filled 2016-12-26: qty 2

## 2016-12-26 MED ORDER — BUPIVACAINE HCL (PF) 0.25 % IJ SOLN
INTRAMUSCULAR | Status: AC
  Filled 2016-12-26: qty 30

## 2016-12-26 MED ORDER — PROPOFOL 10 MG/ML IV BOLUS
INTRAVENOUS | Status: AC
  Filled 2016-12-26: qty 20

## 2016-12-26 MED ORDER — SODIUM CHLORIDE 0.9 % IV BOLUS (SEPSIS)
1000.0000 mL | Freq: Once | INTRAVENOUS | Status: AC
  Administered 2016-12-26: 1000 mL via INTRAVENOUS

## 2016-12-26 MED ORDER — DEXAMETHASONE SODIUM PHOSPHATE 10 MG/ML IJ SOLN
INTRAMUSCULAR | Status: DC | PRN
  Administered 2016-12-26: 10 mg via INTRAVENOUS

## 2016-12-26 MED ORDER — ONDANSETRON HCL 4 MG/2ML IJ SOLN
4.0000 mg | Freq: Once | INTRAMUSCULAR | Status: AC
  Administered 2016-12-26: 4 mg via INTRAVENOUS
  Filled 2016-12-26: qty 2

## 2016-12-26 MED ORDER — IOPAMIDOL (ISOVUE-300) INJECTION 61%
INTRAVENOUS | Status: AC
  Filled 2016-12-26: qty 100

## 2016-12-26 MED ORDER — LIDOCAINE 2% (20 MG/ML) 5 ML SYRINGE
INTRAMUSCULAR | Status: AC
  Filled 2016-12-26: qty 5

## 2016-12-26 MED ORDER — LIDOCAINE 2% (20 MG/ML) 5 ML SYRINGE
INTRAMUSCULAR | Status: DC | PRN
  Administered 2016-12-26: 100 mg via INTRAVENOUS

## 2016-12-26 MED ORDER — SUCCINYLCHOLINE CHLORIDE 200 MG/10ML IV SOSY
PREFILLED_SYRINGE | INTRAVENOUS | Status: AC
  Filled 2016-12-26: qty 10

## 2016-12-26 MED ORDER — PHENYLEPHRINE 40 MCG/ML (10ML) SYRINGE FOR IV PUSH (FOR BLOOD PRESSURE SUPPORT)
PREFILLED_SYRINGE | INTRAVENOUS | Status: AC
  Filled 2016-12-26: qty 10

## 2016-12-26 MED ORDER — IOPAMIDOL (ISOVUE-300) INJECTION 61%
100.0000 mL | Freq: Once | INTRAVENOUS | Status: AC | PRN
  Administered 2016-12-26: 100 mL via INTRAVENOUS

## 2016-12-26 MED ORDER — LACTATED RINGERS IR SOLN
Status: DC | PRN
  Administered 2016-12-26: 1000 mL

## 2016-12-26 MED ORDER — ONDANSETRON HCL 4 MG/2ML IJ SOLN
INTRAMUSCULAR | Status: AC
  Filled 2016-12-26: qty 2

## 2016-12-26 MED ORDER — SUGAMMADEX SODIUM 200 MG/2ML IV SOLN
INTRAVENOUS | Status: AC
  Filled 2016-12-26: qty 2

## 2016-12-26 MED ORDER — 0.9 % SODIUM CHLORIDE (POUR BTL) OPTIME
TOPICAL | Status: DC | PRN
  Administered 2016-12-26: 1000 mL

## 2016-12-26 MED ORDER — METRONIDAZOLE IN NACL 5-0.79 MG/ML-% IV SOLN
500.0000 mg | Freq: Once | INTRAVENOUS | Status: AC
  Administered 2016-12-26: 500 mg via INTRAVENOUS
  Filled 2016-12-26: qty 100

## 2016-12-26 MED ORDER — MORPHINE SULFATE (PF) 4 MG/ML IV SOLN
4.0000 mg | Freq: Once | INTRAVENOUS | Status: AC
  Administered 2016-12-26: 4 mg via INTRAVENOUS
  Filled 2016-12-26: qty 1

## 2016-12-26 MED ORDER — ROCURONIUM BROMIDE 50 MG/5ML IV SOSY
PREFILLED_SYRINGE | INTRAVENOUS | Status: AC
  Filled 2016-12-26: qty 5

## 2016-12-26 MED ORDER — HYDROMORPHONE HCL 1 MG/ML IJ SOLN: 0.5000 mg | Freq: Once | INTRAMUSCULAR | Status: DC

## 2016-12-26 MED ORDER — PROPOFOL 10 MG/ML IV BOLUS
INTRAVENOUS | Status: DC | PRN
  Administered 2016-12-26: 200 mg via INTRAVENOUS

## 2016-12-26 MED ORDER — MIDAZOLAM HCL 2 MG/2ML IJ SOLN
INTRAMUSCULAR | Status: DC | PRN
  Administered 2016-12-26: 2 mg via INTRAVENOUS

## 2016-12-26 MED ORDER — FENTANYL CITRATE (PF) 100 MCG/2ML IJ SOLN
INTRAMUSCULAR | Status: DC | PRN
  Administered 2016-12-26: 50 ug via INTRAVENOUS
  Administered 2016-12-26: 100 ug via INTRAVENOUS
  Administered 2016-12-26 – 2016-12-27 (×3): 50 ug via INTRAVENOUS

## 2016-12-26 MED ORDER — LACTATED RINGERS IV SOLN
INTRAVENOUS | Status: DC | PRN
  Administered 2016-12-26 (×2): via INTRAVENOUS

## 2016-12-26 SURGICAL SUPPLY — 42 items
ADH SKN CLS APL DERMABOND .7 (GAUZE/BANDAGES/DRESSINGS) ×1
APPLIER CLIP 5 13 M/L LIGAMAX5 (MISCELLANEOUS)
APPLIER CLIP ROT 10 11.4 M/L (STAPLE)
APR CLP MED LRG 11.4X10 (STAPLE)
APR CLP MED LRG 5 ANG JAW (MISCELLANEOUS)
BAG SPEC RTRVL LRG 6X4 10 (ENDOMECHANICALS) ×1
CABLE HIGH FREQUENCY MONO STRZ (ELECTRODE) ×2 IMPLANT
CHLORAPREP W/TINT 26ML (MISCELLANEOUS) ×3 IMPLANT
CLIP APPLIE 5 13 M/L LIGAMAX5 (MISCELLANEOUS) IMPLANT
CLIP APPLIE ROT 10 11.4 M/L (STAPLE) IMPLANT
COVER SURGICAL LIGHT HANDLE (MISCELLANEOUS) IMPLANT
CUTTER FLEX LINEAR 45M (STAPLE) ×2 IMPLANT
DECANTER SPIKE VIAL GLASS SM (MISCELLANEOUS) ×3 IMPLANT
DERMABOND ADVANCED (GAUZE/BANDAGES/DRESSINGS) ×2
DERMABOND ADVANCED .7 DNX12 (GAUZE/BANDAGES/DRESSINGS) IMPLANT
DRAPE LAPAROSCOPIC ABDOMINAL (DRAPES) ×3 IMPLANT
ELECT REM PT RETURN 15FT ADLT (MISCELLANEOUS) ×3 IMPLANT
GLOVE BIOGEL PI IND STRL 7.5 (GLOVE) ×1 IMPLANT
GLOVE BIOGEL PI INDICATOR 7.5 (GLOVE) ×6
GLOVE ECLIPSE 7.5 STRL STRAW (GLOVE) ×3 IMPLANT
GLOVE SURG SS PI 7.5 STRL IVOR (GLOVE) ×2 IMPLANT
GOWN STRL REUS W/ TWL XL LVL3 (GOWN DISPOSABLE) IMPLANT
GOWN STRL REUS W/TWL XL LVL3 (GOWN DISPOSABLE) ×9 IMPLANT
IRRIG SUCT STRYKERFLOW 2 WTIP (MISCELLANEOUS) ×3
IRRIGATION SUCT STRKRFLW 2 WTP (MISCELLANEOUS) ×1 IMPLANT
KIT BASIN OR (CUSTOM PROCEDURE TRAY) ×3 IMPLANT
PAD POSITIONING PINK XL (MISCELLANEOUS) ×3 IMPLANT
POUCH SPECIMEN RETRIEVAL 10MM (ENDOMECHANICALS) ×3 IMPLANT
RELOAD 45 VASCULAR/THIN (ENDOMECHANICALS) IMPLANT
RELOAD STAPLE 45 2.5 WHT GRN (ENDOMECHANICALS) IMPLANT
RELOAD STAPLE 45 3.5 BLU ETS (ENDOMECHANICALS) IMPLANT
RELOAD STAPLE TA45 3.5 REG BLU (ENDOMECHANICALS) ×3 IMPLANT
SCISSORS LAP 5X35 DISP (ENDOMECHANICALS) ×3 IMPLANT
SHEARS HARMONIC ACE PLUS 36CM (ENDOMECHANICALS) ×3 IMPLANT
SLEEVE XCEL OPT CAN 5 100 (ENDOMECHANICALS) ×3 IMPLANT
SUT MNCRL AB 4-0 PS2 18 (SUTURE) ×3 IMPLANT
TOWEL OR 17X26 10 PK STRL BLUE (TOWEL DISPOSABLE) ×3 IMPLANT
TRAY FOLEY W/METER SILVER 16FR (SET/KITS/TRAYS/PACK) ×3 IMPLANT
TRAY LAPAROSCOPIC (CUSTOM PROCEDURE TRAY) ×3 IMPLANT
TROCAR BLADELESS OPT 5 100 (ENDOMECHANICALS) ×3 IMPLANT
TROCAR XCEL BLUNT TIP 100MML (ENDOMECHANICALS) ×3 IMPLANT
TUBING INSUF HEATED (TUBING) ×3 IMPLANT

## 2016-12-26 NOTE — ED Provider Notes (Signed)
[facility]-EMERGENCY DEPT Provider Note   CSN: [id number] Arrival date & time: 12/26/16  1955     History   Chief Complaint Chief Complaint  Patient presents with  . Abdominal Pain    HPI William Allison is a 26 y.o. male.  HPI   26 year old male presents today with complaints of abdominal pain.  Patient notes pain started in the right lower quadrant yesterday with associated nausea and vomiting.  Patient notes that this pain is similar to previous that went away on its own.  He notes this is sharp in nature worse with movement, worse with palpation of the right lower quadrant.  He denies any upper abdominal pain, fever, penile discharge, urine changes.  Patient reports he feels dehydrated as he has not had anything to eat or drink today due to nausea and vomiting.  No history of abdominal surgeries.  Normal bowel movements.   Past Medical History:  Diagnosis Date  . ADD (attention deficit disorder)   . Constipation 05/30/12   [facility], [facility]; Donnatal q.i.d. Follow up in 4 weeks.  Marland Kitchen GERD (gastroesophageal reflux disease)     There are no active problems to display for this patient.   Past Surgical History:  Procedure Laterality Date  . HAND SURGERY     for fracture  . TYMPANOSTOMY TUBE PLACEMENT       Home Medications    Prior to Admission medications   Medication Sig Start Date End Date Taking? Authorizing Provider  amphetamine-dextroamphetamine (ADDERALL) 20 MG tablet Take 20 mg by mouth daily.  12/11/16  Yes Historical Provider, MD  cyclobenzaprine (FLEXERIL) 10 MG tablet Take 1 tablet (10 mg total) by mouth 2 (two) times daily as needed for muscle spasms. Patient not taking: Reported on 12/26/2016 07/26/16   Eyvonne Mechanic, PA-C  naproxen (NAPROSYN) 500 MG tablet Take 1 tablet (500 mg total) by mouth 2 (two) times daily. Patient not taking: Reported on 12/26/2016 07/26/16   Eyvonne Mechanic, PA-C    Family History History reviewed. No pertinent family  history.  Social History Social History  Substance Use Topics  . Smoking status: Current Every Day Smoker    Packs/day: 0.50    Types: Cigarettes  . Smokeless tobacco: Never Used  . Alcohol use Yes     Comment: every day     Allergies   Patient has no known allergies.   Review of Systems Review of Systems  All other systems reviewed and are negative.    Physical Exam Updated Vital Signs BP 128/88 (BP Location: Right Arm)   Pulse (!) 125   Temp 99.7 F (37.6 C) (Oral)   Resp 20   Ht 6\' 4"  (1.93 m)   Wt 79.4 kg   SpO2 100%   BMI 21.30 kg/m   Physical Exam  Constitutional: He is oriented to person, place, and time. He appears well-developed and well-nourished.  HENT:  Head: Normocephalic and atraumatic.  Eyes: Conjunctivae are normal. Pupils are equal, round, and reactive to light. Right eye exhibits no discharge. Left eye exhibits no discharge. No scleral icterus.  Neck: Normal range of motion. No JVD present. No tracheal deviation present.  Pulmonary/Chest: Effort normal. No stridor.  Abdominal: He exhibits no distension and no mass. There is tenderness. There is no rebound and no guarding. No hernia.  Tenderness palpation or right lower quadrant.  Neurological: He is alert and oriented to person, place, and time. Coordination normal.  Psychiatric: He has a normal mood and affect.  His behavior is normal. Judgment and thought content normal.  Nursing note and vitals reviewed.    ED Treatments / Results  Labs (all labs ordered are listed, but only abnormal results are displayed) Labs Reviewed  COMPREHENSIVE METABOLIC PANEL - Abnormal; Notable for the following:       Result Value   Glucose, Bld 120 (*)    Total Protein 8.7 (*)    AST 53 (*)    All other components within normal limits  URINALYSIS, ROUTINE W REFLEX MICROSCOPIC - Abnormal; Notable for the following:    Color, Urine AMBER (*)    APPearance HAZY (*)    Specific Gravity, Urine 1.032 (*)     Protein, ur 100 (*)    Squamous Epithelial / LPF 0-5 (*)    All other components within normal limits  LIPASE, BLOOD  CBC    EKG  EKG Interpretation None       Radiology Ct Abdomen Pelvis W Contrast  Result Date: 12/26/2016 CLINICAL DATA:  Acute onset of right lower quadrant abdominal pain and vomiting. Initial encounter. EXAM: CT ABDOMEN AND PELVIS WITH CONTRAST TECHNIQUE: Multidetector CT imaging of the abdomen and pelvis was performed using the standard protocol following bolus administration of intravenous contrast. CONTRAST:  ISOVUE-300 IOPAMIDOL (ISOVUE-300) INJECTION 61% COMPARISON:  CT of the abdomen and pelvis from 09/15/2011 FINDINGS: Lower chest: The visualized lung bases are grossly clear. The visualized portions of the mediastinum are unremarkable. Hepatobiliary: The liver is unremarkable in appearance. The gallbladder is unremarkable in appearance. The common bile duct remains normal in caliber. Pancreas: The pancreas is within normal limits. Spleen: The spleen is unremarkable in appearance. Adrenals/Urinary Tract: The adrenal glands are unremarkable in appearance. The kidneys are within normal limits. There is no evidence of hydronephrosis. No renal or ureteral stones are identified. No perinephric stranding is seen. Stomach/Bowel: There is dilatation of the appendix to 1.1 cm in maximal diameter, with surrounding soft tissue inflammation and mild wall thickening, concerning for acute appendicitis. Trace fluid is seen along the right paracolic gutter. There is no evidence of perforation or abscess formation at this time. The appendix is retrocecal in nature. The colon is unremarkable in appearance. The small bowel is grossly unremarkable. The stomach is within normal limits. Vascular/Lymphatic: The abdominal aorta is unremarkable in appearance. The inferior vena cava is grossly unremarkable. No retroperitoneal lymphadenopathy is seen. No pelvic sidewall lymphadenopathy is  identified. Reproductive: The bladder is decompressed and not well assessed. The prostate remains normal in size. Other: No additional soft tissue abnormalities are seen. Musculoskeletal: No acute osseous abnormalities are identified. The visualized musculature is unremarkable in appearance. IMPRESSION: Acute appendicitis, with dilatation of the appendix to 1.1 cm in maximal diameter, surrounding soft tissue inflammation and mild wall thickening. Trace associated free fluid seen. No evidence of perforation or abscess formation at this time. The appendix is retrocecal in nature. These results were called by telephone at the time of interpretation on 12/26/2016 at 9:43 pm to Emberli Ballester PA, who verbally acknowledged these results. Electronically Signed   By: Roanna Raider M.D.   On: 12/26/2016 21:44    Procedures Procedures (including critical care time)  Medications Ordered in ED Medications  iopamidol (ISOVUE-300) 61 % injection (not administered)  cefTRIAXone (ROCEPHIN) 2 g in dextrose 5 % 50 mL IVPB (2 g Intravenous New Bag/Given 12/26/16 2211)    And  metroNIDAZOLE (FLAGYL) IVPB 500 mg (500 mg Intravenous New Bag/Given 12/26/16 2211)  HYDROmorphone (DILAUDID) injection 0.5 mg (  not administered)  sodium chloride 0.9 % bolus 1,000 mL (1,000 mLs Intravenous New Bag/Given 12/26/16 2055)  morphine 4 MG/ML injection 4 mg (4 mg Intravenous Given 12/26/16 2057)  ondansetron (ZOFRAN) injection 4 mg (4 mg Intravenous Given 12/26/16 2057)  iopamidol (ISOVUE-300) 61 % injection 100 mL (100 mLs Intravenous Contrast Given 12/26/16 2118)     Initial Impression / Assessment and Plan / ED Course  I have reviewed the triage vital signs and the nursing notes.  Pertinent labs & imaging results that were available during my care of the patient were reviewed by me and considered in my medical decision making (see chart for details).      Final Clinical Impressions(s) / ED Diagnoses   Final diagnoses:  Acute  appendicitis, unspecified acute appendicitis type    Labs: Lipase, CMP, CBC  Imaging: The abdomen and pelvis with contrast  Consults: General surgery  Therapeutics: Ceftriaxone, metronidazole  Discharge Meds:   Assessment/Plan: 26 year old male presents today with acute appendicitis.  He is afebrile, slightly tachycardic.  CT scan shows no complicating features.  Patient is an otherwise healthy young male.  General surgery consulted who would evaluate the patient.  Patient given dose of metronidazole and ceftriaxone here in the ED.      New Prescriptions New Prescriptions   No medications on file     Eyvonne MechanicJeffrey Amaan Meyer, PA-C 12/26/16 2228    Arby BarretteMarcy Pfeiffer, MD 12/27/16 [phone]

## 2016-12-26 NOTE — ED Notes (Signed)
Pt transported to surgery

## 2016-12-26 NOTE — ED Notes (Signed)
Pt aware we need urine sample but says he is too dehydrated

## 2016-12-26 NOTE — H&P (Signed)
William Allison is an 26 y.o. male.    Chief Complaint: Abdominal pain  HPI: Patient is a generally healthy 26 year old male who about 36 hours ago had a fairly rapid onset of mid abdominal and right lower quadrant abdominal pain which has been constant. Worse with any motion. Gradually worsening. This has been associated with chills and subjective fever as well as nausea and vomiting. No change in bowel habits. He does not have any chronic GI complaints. He did have a very similar episode of pain about 2 or 3 months ago that resolved after a day or 2 without medical attention. He presented to the emergency department for evaluation.  Past Medical History:  Diagnosis Date  . ADD (attention deficit disorder)   . Constipation 05/30/12   [facility], [facility]; Donnatal q.i.d. Follow up in 4 weeks.  Marland Kitchen GERD (gastroesophageal reflux disease)     Past Surgical History:  Procedure Laterality Date  . HAND SURGERY     for fracture  . TYMPANOSTOMY TUBE PLACEMENT      History reviewed. No pertinent family history. Social History:  reports that he has been smoking Cigarettes.  He has been smoking about 0.50 packs per day. He has never used smokeless tobacco. He reports that he drinks alcohol. He reports that he uses drugs, including Marijuana and Cocaine.  Allergies: No Known Allergies  Current Facility-Administered Medications  Medication Dose Route Frequency Provider Last Rate Last Dose  . cefTRIAXone (ROCEPHIN) 2 g in dextrose 5 % 50 mL IVPB  2 g Intravenous Once Okey Regal, PA-C 100 mL/hr at 12/26/16 2211 2 g at 12/26/16 2211   And  . metroNIDAZOLE (FLAGYL) IVPB 500 mg  500 mg Intravenous Once Okey Regal, PA-C 100 mL/hr at 12/26/16 2211 500 mg at 12/26/16 2211  . HYDROmorphone (DILAUDID) injection 0.5 mg  0.5 mg Intravenous Once [employer], PA-C      . iopamidol (ISOVUE-300) 61 % injection            Current Outpatient Prescriptions  Medication Sig Dispense Refill  .  amphetamine-dextroamphetamine (ADDERALL) 20 MG tablet Take 20 mg by mouth daily.     . cyclobenzaprine (FLEXERIL) 10 MG tablet Take 1 tablet (10 mg total) by mouth 2 (two) times daily as needed for muscle spasms. (Patient not taking: Reported on 12/26/2016) 20 tablet 0  . naproxen (NAPROSYN) 500 MG tablet Take 1 tablet (500 mg total) by mouth 2 (two) times daily. (Patient not taking: Reported on 12/26/2016) 30 tablet 0     Results for orders placed or performed during the hospital encounter of 12/26/16 (from the past 48 hour(s))  Lipase, blood     Status: None   Collection Time: 12/26/16  8:10 PM  Result Value Ref Range   Lipase 27 11 - 51 U/L  Comprehensive metabolic panel     Status: Abnormal   Collection Time: 12/26/16  8:10 PM  Result Value Ref Range   Sodium 139 135 - 145 mmol/L   Potassium 3.8 3.5 - 5.1 mmol/L   Chloride 101 101 - 111 mmol/L   CO2 28 22 - 32 mmol/L   Glucose, Bld 120 (H) 65 - 99 mg/dL   BUN 15 6 - 20 mg/dL   Creatinine, Ser 1.10 0.61 - 1.24 mg/dL   Calcium 9.9 8.9 - 10.3 mg/dL   Total Protein 8.7 (H) 6.5 - 8.1 g/dL   Albumin 4.8 3.5 - 5.0 g/dL   AST 53 (H) 15 - 41 U/L  ALT 33 17 - 63 U/L   Alkaline Phosphatase 90 38 - 126 U/L   Total Bilirubin 1.0 0.3 - 1.2 mg/dL   GFR calc non Af Amer >60 >60 mL/min   GFR calc Af Amer >60 >60 mL/min    Comment: (NOTE) The eGFR has been calculated using the CKD EPI equation. This calculation has not been validated in all clinical situations. eGFR's persistently <60 mL/min signify possible Chronic Kidney Disease.    Anion gap 10 5 - 15  CBC     Status: None   Collection Time: 12/26/16  8:10 PM  Result Value Ref Range   WBC 9.6 4.0 - 10.5 K/uL   RBC 4.87 4.22 - 5.81 MIL/uL   Hemoglobin 15.4 13.0 - 17.0 g/dL   HCT 44.3 39.0 - 52.0 %   MCV 91.0 78.0 - 100.0 fL   MCH 31.6 26.0 - 34.0 pg   MCHC 34.8 30.0 - 36.0 g/dL   RDW 12.5 11.5 - 15.5 %   Platelets 170 150 - 400 K/uL  Urinalysis, Routine w reflex microscopic      Status: Abnormal   Collection Time: 12/26/16  9:36 PM  Result Value Ref Range   Color, Urine AMBER (A) YELLOW    Comment: BIOCHEMICALS MAY BE AFFECTED BY COLOR   APPearance HAZY (A) CLEAR   Specific Gravity, Urine 1.032 (H) 1.005 - 1.030   pH 5.0 5.0 - 8.0   Glucose, UA NEGATIVE NEGATIVE mg/dL   Hgb urine dipstick NEGATIVE NEGATIVE   Bilirubin Urine NEGATIVE NEGATIVE   Ketones, ur NEGATIVE NEGATIVE mg/dL   Protein, ur 100 (A) NEGATIVE mg/dL   Nitrite NEGATIVE NEGATIVE   Leukocytes, UA NEGATIVE NEGATIVE   RBC / HPF 0-5 0 - 5 RBC/hpf   WBC, UA 0-5 0 - 5 WBC/hpf   Bacteria, UA NONE SEEN NONE SEEN   Squamous Epithelial / LPF 0-5 (A) NONE SEEN   Mucous PRESENT    Ct Abdomen Pelvis W Contrast  Result Date: 12/26/2016 CLINICAL DATA:  Acute onset of right lower quadrant abdominal pain and vomiting. Initial encounter. EXAM: CT ABDOMEN AND PELVIS WITH CONTRAST TECHNIQUE: Multidetector CT imaging of the abdomen and pelvis was performed using the standard protocol following bolus administration of intravenous contrast. CONTRAST:  123m ISOVUE-300 IOPAMIDOL (ISOVUE-300) INJECTION 61% COMPARISON:  CT of the abdomen and pelvis from 09/15/2011 FINDINGS: Lower chest: The visualized lung bases are grossly clear. The visualized portions of the mediastinum are unremarkable. Hepatobiliary: The liver is unremarkable in appearance. The gallbladder is unremarkable in appearance. The common bile duct remains normal in caliber. Pancreas: The pancreas is within normal limits. Spleen: The spleen is unremarkable in appearance. Adrenals/Urinary Tract: The adrenal glands are unremarkable in appearance. The kidneys are within normal limits. There is no evidence of hydronephrosis. No renal or ureteral stones are identified. No perinephric stranding is seen. Stomach/Bowel: There is dilatation of the appendix to 1.1 cm in maximal diameter, with surrounding soft tissue inflammation and mild wall thickening, concerning for acute  appendicitis. Trace fluid is seen along the right paracolic gutter. There is no evidence of perforation or abscess formation at this time. The appendix is retrocecal in nature. The colon is unremarkable in appearance. The small bowel is grossly unremarkable. The stomach is within normal limits. Vascular/Lymphatic: The abdominal aorta is unremarkable in appearance. The inferior vena cava is grossly unremarkable. No retroperitoneal lymphadenopathy is seen. No pelvic sidewall lymphadenopathy is identified. Reproductive: The bladder is decompressed and not well assessed. The prostate  remains normal in size. Other: No additional soft tissue abnormalities are seen. Musculoskeletal: No acute osseous abnormalities are identified. The visualized musculature is unremarkable in appearance. IMPRESSION: Acute appendicitis, with dilatation of the appendix to 1.1 cm in maximal diameter, surrounding soft tissue inflammation and mild wall thickening. Trace associated free fluid seen. No evidence of perforation or abscess formation at this time. The appendix is retrocecal in nature. These results were called by telephone at the time of interpretation on 12/26/2016 at 9:43 pm to [place] PA, who verbally acknowledged these results. Electronically Signed   By: Garald Balding M.D.   On: 12/26/2016 21:44    Review of Systems  Constitutional: Positive for chills, fever and malaise/fatigue.  Respiratory: Negative.   Cardiovascular: Negative.   Gastrointestinal: Positive for abdominal pain, nausea and vomiting. Negative for blood in stool, constipation, diarrhea and melena.  Genitourinary: Negative.     Blood pressure 128/88, pulse (!) 125, temperature 99.7 F (37.6 C), temperature source Oral, resp. rate 20, height _0  (1.93 m), weight 79.4 kg (175 lb), SpO2 100 %. Physical Exam  General: Alert, pleasant thin Caucasian male somewhat uncomfortable Skin: Warm and dry without rash or infection. HEENT: No palpable masses  or thyromegaly. Sclera nonicteric. Pupils equal round and reactive.  Lymph nodes: No cervical, supraclavicular, or inguinal nodes palpable. Lungs: Breath sounds clear and equal without increased work of breathing Cardiovascular: Regular rate and rhythm without murmur. No JVD or edema. Peripheral pulses intact. Abdomen: Nondistended. There is localized right lower quadrant tenderness and guarding. No masses palpable. No organomegaly. No palpable hernias. Extremities: No edema or joint swelling or deformity. No chronic venous stasis changes. Neurologic: Alert and fully oriented. Affect normal. No gross motor deficits.  Assessment/Plan I have personally reviewed his CT scan. Symptoms and physical findings are entirely consistent with acute appendicitis and this is confirmed on CT. I discussed options for treatment including antibiotics and surgery. I believe he would be best served with laparoscopic appendectomy and he is in agreement. We discussed the nature of the surgery and its indications, expected recovery and potential risks of open surgery, anesthetic complications, bleeding or infection. All his questions were answered and he agrees to proceed.  Edward Jolly, MD 12/26/2016, 10:30 PM

## 2016-12-26 NOTE — ED Triage Notes (Signed)
Pt states he has right lower quadrant pain that started yesterday  Pt states the pain gets worse with movement  Pt states he had nausea and vomiting yesterday  Pt states it is tender to touch

## 2016-12-26 NOTE — Anesthesia Procedure Notes (Signed)
Procedure Name: Intubation Date/Time: 12/26/2016 11:24 PM Performed by: Minerva EndsMIRARCHI, Liberty Stead M Pre-anesthesia Checklist: Patient identified, Emergency Drugs available, Suction available and Patient being monitored Patient Re-evaluated:Patient Re-evaluated prior to inductionOxygen Delivery Method: Circle System Utilized Preoxygenation: Pre-oxygenation with 100% oxygen Intubation Type: IV induction, Rapid sequence and Cricoid Pressure applied Laryngoscope Size: Miller and 2 Grade View: Grade I Tube type: Oral Number of attempts: 1 Airway Equipment and Method: Stylet Placement Confirmation: ETT inserted through vocal cords under direct vision,  positive ETCO2 and breath sounds checked- equal and bilateral Secured at: 23 cm Tube secured with: Tape Dental Injury: Teeth and Oropharynx as per pre-operative assessment  Comments: Smooth RSI--- Malen GauzeFoster--- intubation AM CRNA atraumatic-- teeth and mouth as preop --- bilat BS Malen GauzeFoster

## 2016-12-26 NOTE — ED Notes (Signed)
Pt states  he has been having RLQ pain/10 x 2 days, described as dull and sharp. Pt states it worsens with movement. Pt denies diarrhea but reports vomiting yellow emesis x 2 in the past 24 hours, and reports that last BM was 12/25/16 and appeared yan. Pt does report having similar pain prior to this visit that in end resolved on its own.

## 2016-12-26 NOTE — Anesthesia Preprocedure Evaluation (Addendum)
Anesthesia Evaluation  Patient identified by MRN, date of birth, ID band Patient awake    Reviewed: Allergy & Precautions, NPO status , Patient's Chart, lab work & pertinent test results  Airway Mallampati: I  TM Distance: >3 FB Neck ROM: Full    Dental no notable dental hx. (+) Teeth Intact, Dental Advisory Given   Pulmonary Current Smoker,    Pulmonary exam normal breath sounds clear to auscultation       Cardiovascular negative cardio ROS Normal cardiovascular exam Rhythm:Regular Rate:Normal     Neuro/Psych ADDnegative neurological ROS     GI/Hepatic Neg liver ROS, GERD  Medicated and Controlled,Acute Appendicitis   Endo/Other  negative endocrine ROS  Renal/GU negative Renal ROS  negative genitourinary   Musculoskeletal negative musculoskeletal ROS (+)   Abdominal   Peds  Hematology negative hematology ROS (+)   Anesthesia Other Findings   Reproductive/Obstetrics                            Anesthesia Physical Anesthesia Plan  ASA: II and emergent  Anesthesia Plan: General   Post-op Pain Management:    Induction: Intravenous, Rapid sequence and Cricoid pressure planned  Airway Management Planned: Oral ETT  Additional Equipment:   Intra-op Plan:   Post-operative Plan: Extubation in OR  Informed Consent: I have reviewed the patients History and Physical, chart, labs and discussed the procedure including the risks, benefits and alternatives for the proposed anesthesia with the patient or authorized representative who has indicated his/her understanding and acceptance.   Dental advisory given  Plan Discussed with: Anesthesiologist, CRNA and Surgeon  Anesthesia Plan Comments:        Anesthesia Quick Evaluation

## 2016-12-27 ENCOUNTER — Encounter (HOSPITAL_COMMUNITY): Payer: Self-pay | Admitting: General Surgery

## 2016-12-27 DIAGNOSIS — K353 Acute appendicitis with localized peritonitis, without perforation or gangrene: Secondary | ICD-10-CM | POA: Diagnosis present

## 2016-12-27 MED ORDER — HYDROCODONE-ACETAMINOPHEN 5-325 MG PO TABS
1.0000 | ORAL_TABLET | ORAL | Status: DC | PRN
  Administered 2016-12-27: 2 via ORAL
  Filled 2016-12-27: qty 2

## 2016-12-27 MED ORDER — BUPIVACAINE HCL (PF) 0.25 % IJ SOLN
INTRAMUSCULAR | Status: DC | PRN
  Administered 2016-12-27: 11 mL

## 2016-12-27 MED ORDER — FENTANYL CITRATE (PF) 100 MCG/2ML IJ SOLN
INTRAMUSCULAR | Status: AC
  Filled 2016-12-27: qty 2

## 2016-12-27 MED ORDER — PANTOPRAZOLE SODIUM 40 MG IV SOLR
40.0000 mg | Freq: Every day | INTRAVENOUS | Status: DC
  Administered 2016-12-27: 03:00:00 40 mg via INTRAVENOUS
  Filled 2016-12-27: qty 40

## 2016-12-27 MED ORDER — DEXTROSE IN LACTATED RINGERS 5 % IV SOLN: INTRAVENOUS | Status: DC

## 2016-12-27 MED ORDER — AMPHETAMINE-DEXTROAMPHETAMINE 10 MG PO TABS
20.0000 mg | ORAL_TABLET | Freq: Every day | ORAL | Status: DC
  Filled 2016-12-27: qty 2

## 2016-12-27 MED ORDER — IBUPROFEN 400 MG PO TABS: 600.0000 mg | ORAL_TABLET | Freq: Four times a day (QID) | ORAL | Status: DC | PRN

## 2016-12-27 MED ORDER — ONDANSETRON HCL 4 MG/2ML IJ SOLN
INTRAMUSCULAR | Status: DC | PRN
  Administered 2016-12-27: 4 mg via INTRAVENOUS

## 2016-12-27 MED ORDER — MEPERIDINE HCL 50 MG/ML IJ SOLN: 6.2500 mg | INTRAMUSCULAR | Status: DC | PRN

## 2016-12-27 MED ORDER — SUGAMMADEX SODIUM 200 MG/2ML IV SOLN
INTRAVENOUS | Status: DC | PRN
  Administered 2016-12-27: 175 mg via INTRAVENOUS

## 2016-12-27 MED ORDER — ACETAMINOPHEN 325 MG PO TABS: 650.0000 mg | ORAL_TABLET | Freq: Four times a day (QID) | ORAL | Status: DC | PRN

## 2016-12-27 MED ORDER — ONDANSETRON HCL 4 MG/2ML IJ SOLN: 4.0000 mg | Freq: Four times a day (QID) | INTRAMUSCULAR | Status: DC | PRN

## 2016-12-27 MED ORDER — OXYCODONE HCL 5 MG PO TABS
5.0000 mg | ORAL_TABLET | ORAL | Status: DC | PRN
  Administered 2016-12-27: 10:00:00 5 mg via ORAL
  Filled 2016-12-27: qty 1

## 2016-12-27 MED ORDER — PROMETHAZINE HCL 25 MG/ML IJ SOLN: 6.2500 mg | INTRAMUSCULAR | Status: DC | PRN

## 2016-12-27 MED ORDER — ONDANSETRON 4 MG PO TBDP: 4.0000 mg | ORAL_TABLET | Freq: Four times a day (QID) | ORAL | Status: DC | PRN

## 2016-12-27 MED ORDER — OXYCODONE HCL 5 MG PO TABS: 5.0000 mg | ORAL_TABLET | ORAL | 0 refills | Status: DC | PRN

## 2016-12-27 MED ORDER — MORPHINE SULFATE (PF) 4 MG/ML IV SOLN: 2.0000 mg | INTRAVENOUS | Status: DC | PRN

## 2016-12-27 MED ORDER — HYDROMORPHONE HCL 1 MG/ML IJ SOLN
INTRAMUSCULAR | Status: AC
  Filled 2016-12-27: qty 0.5

## 2016-12-27 MED ORDER — MORPHINE SULFATE (PF) 4 MG/ML IV SOLN
2.0000 mg | INTRAVENOUS | Status: DC | PRN
  Administered 2016-12-27 (×2): 4 mg via INTRAVENOUS
  Administered 2016-12-27: 04:00:00 2 mg via INTRAVENOUS
  Filled 2016-12-27 (×3): qty 1

## 2016-12-27 MED ORDER — ENOXAPARIN SODIUM 40 MG/0.4ML ~~LOC~~ SOLN: 40.0000 mg | SUBCUTANEOUS | Status: DC

## 2016-12-27 MED ORDER — HYDROMORPHONE HCL 1 MG/ML IJ SOLN
0.2500 mg | INTRAMUSCULAR | Status: DC | PRN
  Administered 2016-12-27: 0.5 mg via INTRAVENOUS

## 2016-12-27 NOTE — Discharge Summary (Signed)
[facility] Discharge Summary   Patient ID: William Allison MRN: [id number] DOB/AGE: 24/24/1992 24 y.o.  Admit date: 12/26/2016 Discharge date: 12/27/2016  Admitting Diagnosis: Acute appendicitis  Discharge Diagnosis Patient Active Problem List   Diagnosis Date Noted  . Acute appendicitis with localized peritonitis 12/27/2016    Consultants None  Imaging: Ct Abdomen Pelvis W Contrast  Result Date: 12/26/2016 CLINICAL DATA:  Acute onset of right lower quadrant abdominal pain and vomiting. Initial encounter. EXAM: CT ABDOMEN AND PELVIS WITH CONTRAST TECHNIQUE: Multidetector CT imaging of the abdomen and pelvis was performed using the standard protocol following bolus administration of intravenous contrast. CONTRAST:  100mL ISOVUE-300 IOPAMIDOL (ISOVUE-300) INJECTION 61% COMPARISON:  CT of the abdomen and pelvis from 09/15/2011 FINDINGS: Lower chest: The visualized lung bases are grossly clear. The visualized portions of the mediastinum are unremarkable. Hepatobiliary: The liver is unremarkable in appearance. The gallbladder is unremarkable in appearance. The common bile duct remains normal in caliber. Pancreas: The pancreas is within normal limits. Spleen: The spleen is unremarkable in appearance. Adrenals/Urinary Tract: The adrenal glands are unremarkable in appearance. The kidneys are within normal limits. There is no evidence of hydronephrosis. No renal or ureteral stones are identified. No perinephric stranding is seen. Stomach/Bowel: There is dilatation of the appendix to 1.1 cm in maximal diameter, with surrounding soft tissue inflammation and mild wall thickening, concerning for acute appendicitis. Trace fluid is seen along the right paracolic gutter. There is no evidence of perforation or abscess formation at this time. The appendix is retrocecal in nature. The colon is unremarkable in appearance. The small bowel is grossly unremarkable. The stomach is within normal limits.  Vascular/Lymphatic: The abdominal aorta is unremarkable in appearance. The inferior vena cava is grossly unremarkable. No retroperitoneal lymphadenopathy is seen. No pelvic sidewall lymphadenopathy is identified. Reproductive: The bladder is decompressed and not well assessed. The prostate remains normal in size. Other: No additional soft tissue abnormalities are seen. Musculoskeletal: No acute osseous abnormalities are identified. The visualized musculature is unremarkable in appearance. IMPRESSION: Acute appendicitis, with dilatation of the appendix to 1.1 cm in maximal diameter, surrounding soft tissue inflammation and mild wall thickening. Trace associated free fluid seen. No evidence of perforation or abscess formation at this time. The appendix is retrocecal in nature. These results were called by telephone at the time of interpretation on 12/26/2016 at 9:43 pm to JEFFREY HEDGES PA, who verbally acknowledged these results. Electronically Signed   By: Roanna RaiderJeffery  Chang M.D.   On: 12/26/2016 21:44    Procedures Dr. Johna SheriffHoxworth (12/26/16) - Laparoscopic Appendectomy  Hospital Course:  William Allison is a 26yo male who [facility] with acute onset RLQ abdominal pain.  Workup showed acute appendicitis.  Patient was admitted and underwent procedure listed above.  Tolerated procedure well and was transferred to the floor.  Diet was advanced as tolerated.  On POD1 the patient was voiding well, tolerating diet, ambulating well, pain well controlled, vital signs stable, incisions c/d/i and felt stable for discharge home.  Patient will follow up in our office in 2-3 weeks and knows to call with questions or concerns.    I have personally reviewed the patients medication history on the [place] controlled substance database.   Physical Exam: General:  Alert, NAD, pleasant, comfortable Pulm: effort normal Abd:  Soft, ND, NT, multiple lap incisions C/D/I  Allergies as of 12/27/2016   No Active Allergies      Medication List    STOP taking these medications  cyclobenzaprine 10 MG tablet Commonly known as:  FLEXERIL     TAKE these medications   amphetamine-dextroamphetamine 20 MG tablet Commonly known as:  ADDERALL Take 20 mg by mouth daily.   naproxen 500 MG tablet Commonly known as:  NAPROSYN Take 1 tablet (500 mg total) by mouth 2 (two) times daily.   oxyCODONE 5 MG immediate release tablet Commonly known as:  Oxy IR/ROXICODONE Take 1 tablet (5 mg total) by mouth every 4 (four) hours as needed for severe pain.        Follow-up Information    [facility], [place]. Call.   Specialty:  General Surgery Why:  We are working on your appointment, please call to confirm Contact information: [address] [place] [address] [phone]          Signed: Edson Snowball, [facility] 12/27/2016, 8:03 AM Pager: [phone] Consults: [phone] Mon-Fri 7:00 am-4:30 pm Sat-Sun 7:00 am-11:30 am

## 2016-12-27 NOTE — Progress Notes (Signed)
Patient discharged home. Medicated patient for pain prior to discharge. Prescriptions given. Patient understands when the next meds are due. Precautions, post-op appointments, and signs and symptoms of infection were discussed. Patient has no questions or concerns and is ready to be discharged home.

## 2016-12-27 NOTE — Op Note (Signed)
Preoperative Diagnosis: Acute appendicitis, unspecified acute appendicitis type [K35.80]  Postoprative Diagnosis: Acute appendicitis, unspecified acute appendicitis type [K35.80]  Procedure: Procedure(s): APPENDECTOMY LAPAROSCOPIC   Surgeon: Glenna FellowsHoxworth, Aleaya Latona T   Assistants: None  Anesthesia:  General endotracheal anesthesia  Indications: Patient is a 26 year old male who presents with 36 hours of right lower quadrant pain associated with nausea and vomiting. CT scan shows evidence of acute appendicitis without apparent perforation or abscess. He had a similar episode of pain that lasted about 2 days several months ago and was self-limited. I recommended proceeding with laparoscopic appendectomy. We discussed alternatives and the nature of surgery, expected recovery and risks of bleeding, infection or possible need for open surgery. All questions were answered and he agrees to proceed.    Procedure Detail:  Patient was brought to the operating room, placed in the supine position on the operating table, and general endotracheal anesthesia induced.  Foley catheter was placed. The abdomen was widely sterilely prepped and draped. PAS were in place. He had received broad spectrum IV antibiotics. Patient timeout was performed and correct procedure verified. Access was attained with a 1 cm incision at the umbilicus with an open Hassan technique through a mattress suture of 0 Vicryl and pneumoperitoneum established. Under direct vision 5 mm trochars were placed in the upper midline in the left lower quadrant. The appendix was acutely inflamed and lying lateral to the cecum with exudate. The base was relatively uninflamed. Lateral peritoneal attachments were divided mobilizing the cecum and appendix. The mesial appendix was very thickened and the appendix was chronically adhered to the lateral wall of the cecum. The mesoappendix was sequentially divided with the Harmonic scalpel and the appendix and  mesoappendix carefully dissected out off of the lateral wall of the cecum until the appendix was completely freed out of the tip of the cecum. It was divided across the tip of the cecum with a single firing of the Endo GIA 45 mm blue load stapler. The staple line was intact and without bleeding. The appendix was placed in an Endo Catch bag and brought out through the umbilical incision. The mattress suture was secured. The abdomen was thoroughly irrigated and hemostasis assured. No bleeding, evidence of injury or other problems. All CO2 was evacuated and trochars removed. Skin incisions were closed with subcuticular 4-0 Monocryl and Dermabond. Sponge needle and instrument counts were correct.    Findings: Acute and chronic appendicitis without perforation or abscess  Estimated Blood Loss:  Minimal         Drains: None  Blood Given: none          Specimens: Appendix        Complications:  * No complications entered in OR log *         Disposition: PACU - hemodynamically stable.         Condition: stable

## 2016-12-27 NOTE — Transfer of Care (Signed)
Immediate Anesthesia Transfer of Care Note  Patient: William Allison  Procedure(s) Performed: Procedure(s): APPENDECTOMY LAPAROSCOPIC (N/A)  Patient Location: PACU  Anesthesia Type:General  Level of Consciousness: awake and alert   Airway & Oxygen Therapy: Patient Spontanous Breathing and Patient connected to face mask oxygen  Post-op Assessment: Report given to RN and Post -op Vital signs reviewed and stable  Post vital signs: Reviewed and stable  Last Vitals:  Vitals:   12/26/16 2237 12/27/16 0043  BP: 117/77   Pulse: 98   Resp: 18   Temp:  37.1 C    Last Pain:  Vitals:   12/27/16 0043  TempSrc:   PainSc: 7       Patients Stated Pain Goal: 2 (12/26/16 2033)  Complications: No apparent anesthesia complications

## 2016-12-27 NOTE — Anesthesia Postprocedure Evaluation (Signed)
Anesthesia Post Note  Patient: William Allison  Procedure(s) Performed: Procedure(s) (LRB): APPENDECTOMY LAPAROSCOPIC (N/A)  Patient location during evaluation: PACU Anesthesia Type: General Level of consciousness: awake and alert Pain management: pain level controlled Vital Signs Assessment: post-procedure vital signs reviewed and stable Respiratory status: spontaneous breathing, nonlabored ventilation and respiratory function stable Cardiovascular status: blood pressure returned to baseline and stable Postop Assessment: no signs of nausea or vomiting Anesthetic complications: no       Last Vitals:  Vitals:   12/26/16 2237 12/27/16 0043  BP: 117/77 129/75  Pulse: 98 (!) 102  Resp: 18 12  Temp:  37.1 C    Last Pain:  Vitals:   12/27/16 0043  TempSrc:   PainSc: 7                  Aliani Caccavale A.

## 2016-12-27 NOTE — Discharge Instructions (Signed)
°LAPAROSCOPIC SURGERY: POST OP INSTRUCTIONS  °1. DIET: Follow a light bland diet the first 24 hours after arrival home, such as soup, liquids, crackers, etc. Be sure to include lots of fluids daily. Avoid fast food or heavy meals as your are more likely to get nauseated. Eat a low fat the next few days after surgery.  °2. Take your usually prescribed home medications unless otherwise directed. °3. PAIN CONTROL:  °1. Pain is best controlled by a usual combination of three different methods TOGETHER:  °1. Ice/Heat °2. Over the counter pain medication °3. Prescription pain medication °2. Most patients will experience some swelling and bruising around the incisions. Ice packs or heating pads (30-60 minutes up to 6 times a day) will help. Use ice for the first few days to help decrease swelling and bruising, then switch to heat to help relax tight/sore spots and speed recovery. Some people prefer to use ice alone, heat alone, alternating between ice & heat. Experiment to what works for you. Swelling and bruising can take several weeks to resolve.  °3. It is helpful to take an over-the-counter pain medication regularly for the first few weeks. Choose one of the following that works best for you:  °1. Naproxen (Aleve, etc) Two 220mg tabs twice a day °2. Ibuprofen (Advil, etc) Three 200mg tabs four times a day (every meal & bedtime) °3. Acetaminophen (Tylenol, etc) 500-650mg four times a day (every meal & bedtime) °4. A prescription for pain medication (such as oxycodone, hydrocodone, etc) should be given to you upon discharge. Take your pain medication as prescribed.  °1. If you are having problems/concerns with the prescription medicine (does not control pain, nausea, vomiting, rash, itching, etc), please call us [phone] to see if we need to switch you to a different pain medicine that will work better for you and/or control your side effect better. °2. If you need a refill on your pain medication, please contact  your pharmacy. They will contact our office to request authorization. Prescriptions will not be filled after 5 pm or on week-ends. °4. Avoid getting constipated. Between the surgery and the pain medications, it is common to experience some constipation. Increasing fluid intake and taking a fiber supplement (such as Metamucil, Citrucel, FiberCon, MiraLax, etc) 1-2 times a day regularly will usually help prevent this problem from occurring. A mild laxative (prune juice, Milk of Magnesia, MiraLax, etc) should be taken according to package directions if there are no bowel movements after 48 hours.  °5. Watch out for diarrhea. If you have many loose bowel movements, simplify your diet to bland foods & liquids for a few days. Stop any stool softeners and decrease your fiber supplement. Switching to mild anti-diarrheal medications (Kayopectate, Pepto Bismol) can help. If this worsens or does not improve, please call us. °6. Wash / shower every day. You may shower over the dressings as they are waterproof. Continue to shower over incision(s) after the dressing is off. If there is glue over the incisions try not to pick it off, let it fall off naturally. °7. Remove your waterproof bandages 2 days after surgery. You may leave the incision open to air. You may replace a dressing/Band-Aid to cover the incision for comfort if you wish.  °8. ACTIVITIES as tolerated:  °1. You may resume regular (light) daily activities beginning the next day--such as daily self-care, walking, climbing stairs--gradually increasing activities as tolerated. If you can walk 30 minutes without difficulty, it is safe to try more intense activity   such as jogging, treadmill, bicycling, low-impact aerobics, swimming, etc. °2. Save the most intensive and strenuous activity for last such as sit-ups, heavy lifting, contact sports, etc Refrain from any heavy lifting or straining until you are off narcotics for pain control. For the first 2-3 weeks do not lift  over 10-15lb.  °3. DO NOT PUSH THROUGH PAIN. Let pain be your guide: If it hurts to do something, don't do it. Pain is your body warning you to avoid that activity for another week until the pain goes down. °4. You may drive when you are no longer taking prescription pain medication, you can comfortably wear a seatbelt, and you can safely maneuver your car and apply brakes. °5. You may have sexual intercourse when it is comfortable.  °9. FOLLOW UP in our office  °1. Please call [facility] at [phone] to set up an appointment to see your surgeon in the office for a follow-up appointment approximately 2-3 weeks after your surgery. °2. Make sure that you call for this appointment the day you arrive home to insure a convenient appointment time. °     10. IF YOU HAVE DISABILITY OR FAMILY LEAVE FORMS, BRING THEM TO THE               OFFICE FOR PROCESSING.  ° °WHEN TO CALL US [phone]:  °1. Poor pain control °2. Reactions / problems with new medications (rash/itching, nausea, etc)  °3. Fever over 101.5 F (38.5 C) °4. Inability to urinate °5. Nausea and/or vomiting °6. Worsening swelling or bruising °7. Continued bleeding from incision. °8. Increased pain, redness, or drainage from the incision ° °The clinic staff is available to answer your questions during regular business hours (8:30am-5pm). Please don’t hesitate to call and ask to speak to one of our nurses for clinical concerns.  °If you have a medical emergency, go to the nearest emergency room or call 911.  °A surgeon from [facility] is always on call at the hospitals  ° °[facility]  °[address], [address], [place], [place] [address] ?  °MAIN: [phone] ? TOLL FREE: [phone] ?  °FAX [phone]  °[email or web address] ° ° °

## 2017-04-13 NOTE — Anesthesia Postprocedure Evaluation (Signed)
Anesthesia Post Note  Patient: William Allison  Procedure(s) Performed: Procedure(s) (LRB): APPENDECTOMY LAPAROSCOPIC (N/A)     Anesthesia Post Evaluation  Last Vitals:  Vitals:   12/27/16 0334 12/27/16 0427  BP: 124/78 137/79  Pulse: (!) 129 64  Resp: 15 16  Temp: 36.9 C 36.8 C    Last Pain:  Vitals:   12/27/16 1007  TempSrc:   PainSc: 5                  Tab [name]

## 2017-04-13 NOTE — Addendum Note (Signed)
Addendum  created 04/13/17 1554 by Mal AmabileFoster, Montie Swiderski, MD   Sign clinical note

## 2017-09-24 ENCOUNTER — Encounter (HOSPITAL_COMMUNITY): Payer: Self-pay | Admitting: Emergency Medicine

## 2017-09-24 ENCOUNTER — Emergency Department (HOSPITAL_COMMUNITY): Payer: BLUE CROSS/BLUE SHIELD

## 2017-09-24 ENCOUNTER — Emergency Department (HOSPITAL_COMMUNITY)
Admission: EM | Admit: 2017-09-24 | Discharge: 2017-09-24 | Disposition: A | Discharge status: Home / Self Care | Payer: BLUE CROSS/BLUE SHIELD | Attending: Emergency Medicine | Admitting: Emergency Medicine

## 2017-09-24 ENCOUNTER — Other Ambulatory Visit: Payer: Self-pay

## 2017-09-24 DIAGNOSIS — W2209XA Striking against other stationary object, initial encounter: Secondary | ICD-10-CM | POA: Insufficient documentation

## 2017-09-24 DIAGNOSIS — Y939 Activity, unspecified: Secondary | ICD-10-CM | POA: Diagnosis not present

## 2017-09-24 DIAGNOSIS — S6000XA Contusion of unspecified finger without damage to nail, initial encounter: Secondary | ICD-10-CM | POA: Insufficient documentation

## 2017-09-24 DIAGNOSIS — Y999 Unspecified external cause status: Secondary | ICD-10-CM | POA: Diagnosis not present

## 2017-09-24 DIAGNOSIS — S6991XA Unspecified injury of right wrist, hand and finger(s), initial encounter: Secondary | ICD-10-CM | POA: Diagnosis present

## 2017-09-24 DIAGNOSIS — Y929 Unspecified place or not applicable: Secondary | ICD-10-CM | POA: Diagnosis not present

## 2017-09-24 MED ORDER — DICLOFENAC SODIUM 50 MG PO TBEC: 50.0000 mg | DELAYED_RELEASE_TABLET | Freq: Two times a day (BID) | ORAL | 0 refills | Status: DC

## 2017-09-24 MED ORDER — HYDROCODONE-ACETAMINOPHEN 5-325 MG PO TABS
1.0000 | ORAL_TABLET | Freq: Once | ORAL | Status: AC
  Administered 2017-09-24: 1 via ORAL
  Filled 2017-09-24: qty 1

## 2017-09-24 NOTE — ED Notes (Signed)
Left hand iced and ace bandage applied.

## 2017-09-24 NOTE — ED Provider Notes (Signed)
[facility]-EMERGENCY DEPT Provider Note   CSN: [id number] Arrival date & time: 09/24/17  1841     History   Chief Complaint Chief Complaint  Patient presents with  . Hand Pain    HPI William Allison is a 26 y.o. male who presents to the ED with right hand pain. Patient reports that he hit a door with his fist last night and today has swelling, bruising and pain with movement. He denies any other injuries. Patient reports having a similar injury 5 years ago that required surgery on his hand. Patient is up to date on tetanus. HPI  Past Medical History:  Diagnosis Date  . ADD (attention deficit disorder)   . Constipation 05/30/12   [facility], [facility]king[facility]; [place] q.i.d. Follow up in 4 weeks.  Marland Kitchen. GERD (gastroesophageal reflux disease)     Patient Active Problem List   Diagnosis Date Noted  . Acute appendicitis with localized peritonitis 12/27/2016    Past Surgical History:  Procedure Laterality Date  . HAND SURGERY     for fracture  . LAPAROSCOPIC APPENDECTOMY N/A 12/26/2016   Procedure: APPENDECTOMY LAPAROSCOPIC;  Surgeon: Glenna FellowsBenjamin Hoxworth, MD;  Location: [facility]S;  Service: General;  Laterality: N/A;  . TYMPANOSTOMY TUBE PLACEMENT         Home Medications    Prior to Admission medications   Medication Sig Start Date End Date Taking? Authorizing Provider  amphetamine-dextroamphetamine (ADDERALL) 20 MG tablet Take 20 mg by mouth daily.  12/11/16   [provider]  diclofenac (VOLTAREN) 50 MG EC tablet Take 1 tablet (50 mg total) by mouth 2 (two) times daily. 09/24/17   Janne NapoleonNeese, Noma Quijas M, NP  naproxen (NAPROSYN) 500 MG tablet Take 1 tablet (500 mg total) by mouth 2 (two) times daily. Patient not taking: Reported on 12/26/2016 07/26/16   Eyvonne MechanicHedges, Jeffrey, PA-C  oxyCODONE (OXY IR/ROXICODONE) 5 MG immediate release tablet Take 1 tablet (5 mg total) by mouth every 4 (four) hours as needed for severe pain. 12/27/16   Meuth, Lina SarBrooke A, PA-C     Family History No family history on file.  Social History Social History   Tobacco Use  . Smoking status: Current Every Day Smoker    Packs/day: 0.50    Types: Cigarettes  . Smokeless tobacco: Never Used  Substance Use Topics  . Alcohol use: Yes    Comment: every day  . Drug use: Yes    Types: Marijuana, Cocaine    Comment: daily     Allergies   Patient has no known allergies.   Review of Systems Review of Systems  Musculoskeletal: Positive for arthralgias.       Right hand pain  All other systems reviewed and are negative.    Physical Exam Updated Vital Signs BP (!) 125/91 (BP Location: Right Arm)   Pulse 92   Temp 98.2 F (36.8 C) (Oral)   Resp 20   SpO2 100%   Physical Exam  Constitutional: He appears well-developed and well-nourished. No distress.  HENT:  Head: Normocephalic and atraumatic.  Eyes: EOM are normal.  Neck: Normal range of motion. Neck supple.  Cardiovascular: Normal rate.  Pulmonary/Chest: Effort normal.  Musculoskeletal:       Right hand: He exhibits tenderness and swelling. He exhibits normal range of motion, normal capillary refill, no deformity and no laceration. Normal sensation noted. Decreased strength noted. He exhibits no thumb/finger opposition.  Radial pulse 2+, adequate circulation. There is swelling, ecchymosis and tenderness to the  dorsum of the right hand.   Neurological: He is alert.  Skin: Skin is warm and dry.  Psychiatric: He has a normal mood and affect. His behavior is normal.  Nursing note and vitals reviewed.    ED Treatments / Results  Labs (all labs ordered are listed, but only abnormal results are displayed) Labs Reviewed - No data to display  Radiology Dg Hand Complete Right  Result Date: 09/24/2017 CLINICAL DATA:  26 year old male with injury to the right hand. EXAM: RIGHT HAND - COMPLETE 3+ VIEW COMPARISON:  Right hand radiograph dated 03/24/2010 FINDINGS: There is no acute fracture or  dislocation. Old healed distal fifth metacarpal fracture. The bones are well mineralized. No arthritic changes. There is soft tissue swelling over the MCP joints. No radiopaque foreign object. IMPRESSION: No acute fracture or dislocation. Electronically Signed   By: Elgie CollardArash  Radparvar M.D.   On: 09/24/2017 20:23    Procedures Procedures (including critical care time)  Medications Ordered in ED Medications  HYDROcodone-acetaminophen (NORCO/VICODIN) 5-325 MG per tablet 1 tablet (not administered)     Initial Impression / Assessment and Plan / ED Course  I have reviewed the triage vital signs and the nursing notes. 26 y.o. male with right hand pain after hitting a door with his fist last night stable for d/c without fracture or dislocation noted on x-ray and no focal neuro deficits. Will treat with ace wrap and NSAIDS. Return precautions discussed with the patient.   Final Clinical Impressions(s) / ED Diagnoses   Final diagnoses:  Contusion of finger of right hand, initial encounter    ED Discharge Orders        Ordered    diclofenac (VOLTAREN) 50 MG EC tablet  2 times daily     09/24/17 2105       Kerrie Buffaloeese, Emalee Knies [place] Columbia[place], [place]NP 09/24/17 2107    Tegeler, Canary Brimhristopher J, MD 09/25/17 [phone]

## 2017-09-24 NOTE — ED Triage Notes (Signed)
Patient reports right hand pain after "punching something last night." Hand swollen in triage.

## 2017-09-24 NOTE — Discharge Instructions (Signed)
Follow up with your doctor or return here for worsening symptoms. °

## 2019-12-30 ENCOUNTER — Other Ambulatory Visit: Payer: Self-pay

## 2019-12-30 ENCOUNTER — Encounter (HOSPITAL_COMMUNITY): Payer: Self-pay | Admitting: *Deleted

## 2019-12-30 ENCOUNTER — Emergency Department (HOSPITAL_COMMUNITY)
Admission: EM | Admit: 2019-12-30 | Discharge: 2019-12-30 | Disposition: A | Discharge status: Home / Self Care | Payer: BLUE CROSS/BLUE SHIELD | Attending: Emergency Medicine | Admitting: Emergency Medicine

## 2019-12-30 DIAGNOSIS — Z79899 Other long term (current) drug therapy: Secondary | ICD-10-CM | POA: Insufficient documentation

## 2019-12-30 DIAGNOSIS — F17210 Nicotine dependence, cigarettes, uncomplicated: Secondary | ICD-10-CM | POA: Insufficient documentation

## 2019-12-30 DIAGNOSIS — L03317 Cellulitis of buttock: Secondary | ICD-10-CM | POA: Insufficient documentation

## 2019-12-30 DIAGNOSIS — F1490 Cocaine use, unspecified, uncomplicated: Secondary | ICD-10-CM | POA: Insufficient documentation

## 2019-12-30 DIAGNOSIS — F1290 Cannabis use, unspecified, uncomplicated: Secondary | ICD-10-CM | POA: Insufficient documentation

## 2019-12-30 DIAGNOSIS — F909 Attention-deficit hyperactivity disorder, unspecified type: Secondary | ICD-10-CM | POA: Insufficient documentation

## 2019-12-30 MED ORDER — HYDROCODONE-ACETAMINOPHEN 5-325 MG PO TABS: 1.0000 | ORAL_TABLET | ORAL | 0 refills | Status: DC | PRN

## 2019-12-30 MED ORDER — HYDROCODONE-ACETAMINOPHEN 5-325 MG PO TABS: 1.0000 | ORAL_TABLET | ORAL | 0 refills | Status: AC | PRN

## 2019-12-30 MED ORDER — SULFAMETHOXAZOLE-TRIMETHOPRIM 800-160 MG PO TABS
1.0000 | ORAL_TABLET | Freq: Once | ORAL | Status: AC
  Administered 2019-12-30: 12:00:00 1 via ORAL
  Filled 2019-12-30: qty 1

## 2019-12-30 MED ORDER — HYDROCODONE-ACETAMINOPHEN 5-325 MG PO TABS
2.0000 | ORAL_TABLET | Freq: Once | ORAL | Status: AC
  Administered 2019-12-30: 2 via ORAL
  Filled 2019-12-30: qty 2

## 2019-12-30 MED ORDER — SULFAMETHOXAZOLE-TRIMETHOPRIM 800-160 MG PO TABS: 1.0000 | ORAL_TABLET | Freq: Two times a day (BID) | ORAL | 0 refills | Status: DC

## 2019-12-30 NOTE — ED Notes (Signed)
Pt verbalized understanding of discharge instructions. Prescriptions and pain management reviewed, pt had no further questions.

## 2019-12-30 NOTE — ED Provider Notes (Signed)
[facility] EMERGENCY DEPARTMENT Provider Note   CSN: [id number] Arrival date & time: 12/30/19  0998     History Chief Complaint  Patient presents with  . Abscess    William Allison is a 29 y.o. male.  The history is provided by the patient. No language interpreter was used.  Abscess Abscess location: low back. Size:  1 Abscess quality: redness and warmth   Abscess quality: no induration   Red streaking: no   Duration:  1 day Progression:  Worsening Chronicity:  New Context: not diabetes and not injected drug use   Relieved by:  Nothing Ineffective treatments:  None tried Associated symptoms: no nausea   Pt complains of a tender painful area at the end of his tailbone     Past Medical History:  Diagnosis Date  . ADD (attention deficit disorder)   . Constipation 05/30/12   [facility], [facility]; Donnatal q.i.d. Follow up in 4 weeks.  Marland Kitchen GERD (gastroesophageal reflux disease)     Patient Active Problem List   Diagnosis Date Noted  . Acute appendicitis with localized peritonitis 12/27/2016    Past Surgical History:  Procedure Laterality Date  . HAND SURGERY     for fracture  . LAPAROSCOPIC APPENDECTOMY N/A 12/26/2016   Procedure: APPENDECTOMY LAPAROSCOPIC;  Surgeon: Glenna Fellows, MD;  Location: [facility];  Service: General;  Laterality: N/A;  . TYMPANOSTOMY TUBE PLACEMENT         History reviewed. No pertinent family history.  Social History   Tobacco Use  . Smoking status: Current Every Day Smoker    Packs/day: 0.50    Types: Cigarettes  . Smokeless tobacco: Never Used  Substance Use Topics  . Alcohol use: Yes    Comment: every day  . Drug use: Yes    Types: Marijuana, Cocaine    Comment: daily    Home Medications Prior to Admission medications   Medication Sig Start Date End Date Taking? Authorizing Provider  amphetamine-dextroamphetamine (ADDERALL) 20 MG tablet Take 20 mg by mouth daily.  12/11/16   [provider]  diclofenac (VOLTAREN) 50 MG EC tablet Take 1 tablet (50 mg total) by mouth 2 (two) times daily. 09/24/17   Janne Napoleon, NP  HYDROcodone-acetaminophen (NORCO/VICODIN) 5-325 MG tablet Take 1 tablet by mouth every 4 (four) hours as needed for moderate pain. 12/30/19 12/29/20  Elson Areas, PA-C  naproxen (NAPROSYN) 500 MG tablet Take 1 tablet (500 mg total) by mouth 2 (two) times daily. Patient not taking: Reported on 12/26/2016 07/26/16   Eyvonne Mechanic, PA-C  oxyCODONE (OXY IR/ROXICODONE) 5 MG immediate release tablet Take 1 tablet (5 mg total) by mouth every 4 (four) hours as needed for severe pain. 12/27/16   Meuth, Brooke A, PA-C  sulfamethoxazole-trimethoprim (BACTRIM DS) 800-160 MG tablet Take 1 tablet by mouth 2 (two) times daily. 12/30/19   Elson Areas, PA-C    Allergies    Patient has no known allergies.  Review of Systems   Review of Systems  Gastrointestinal: Negative for nausea.  All other systems reviewed and are negative.   Physical Exam Updated Vital Signs BP 132/87 (BP Location: Left Arm)   Pulse 79   Temp 97.8 F (36.6 C) (Oral)   Resp 18   Ht 6\' 4"  (1.93 m)   Wt 90.7 kg   SpO2 97%   BMI 24.34 kg/m   Physical Exam Vitals and nursing note reviewed.  Constitutional:  Appearance: He is well-developed.  HENT:     Head: Normocephalic and atraumatic.  Eyes:     Conjunctiva/sclera: Conjunctivae normal.  Cardiovascular:     Rate and Rhythm: Normal rate.     Heart sounds: No murmur.  Pulmonary:     Effort: Pulmonary effort is normal. No respiratory distress.  Abdominal:     Tenderness: There is no abdominal tenderness.  Musculoskeletal:     Cervical back: Neck supple.  Skin:    General: Skin is warm and dry.     Comments: Tender red area cleft,  No palpatble abscess  Neurological:     Mental Status: He is alert.     ED Results / Procedures / Treatments   Labs (all labs ordered are listed, but only abnormal results are  displayed) Labs Reviewed - No data to display  EKG None  Radiology No results found.  Procedures Procedures (including critical care time)  Medications Ordered in ED Medications  sulfamethoxazole-trimethoprim (BACTRIM DS) 800-160 MG per tablet 1 tablet (1 tablet Oral Given 12/30/19 1148)  HYDROcodone-acetaminophen (NORCO/VICODIN) 5-325 MG per tablet 2 tablet (2 tablets Oral Given 12/30/19 1149)    ED Course  I have reviewed the triage vital signs and the nursing notes.  Pertinent labs & imaging results that were available during my care of the patient were reviewed by me and considered in my medical decision making (see chart for details).    MDM Rules/Calculators/A&P                      MDM  I ultrasounded area, no obvious abscess pocket. Pt given rx for bactrim and hydrocdone.  Pt advised to follow up with surgery.  Pt advised to recheck if symptoms worsen or cahange Final Clinical Impression(s) / ED Diagnoses Final diagnoses:  Cellulitis of buttock   An After Visit Summary was printed and given to the patient.  Rx / DC Orders ED Discharge Orders         Ordered    sulfamethoxazole-trimethoprim (BACTRIM DS) 800-160 MG tablet  2 times daily     12/30/19 1127    HYDROcodone-acetaminophen (NORCO/VICODIN) 5-325 MG tablet  Every 4 hours PRN,   Status:  Discontinued     12/30/19 1127    HYDROcodone-acetaminophen (NORCO/VICODIN) 5-325 MG tablet  Every 4 hours PRN     12/30/19 1131           William Allison 12/30/19 1229    Dorie Rank, MD 01/02/20 [phone]

## 2019-12-30 NOTE — ED Triage Notes (Signed)
Pt reports having abscess on his buttock/tailbone area. Denies fever.

## 2019-12-30 NOTE — Discharge Instructions (Signed)
Return if any problems.  Soak area 20 minutes every 2 hours while awake today and tomorrow.  Return if symptoms worsen

## 2020-02-22 ENCOUNTER — Emergency Department (HOSPITAL_COMMUNITY)
Admission: EM | Admit: 2020-02-22 | Discharge: 2020-02-22 | Disposition: A | Discharge status: Home / Self Care | Payer: Self-pay | Attending: Emergency Medicine | Admitting: Emergency Medicine

## 2020-02-22 ENCOUNTER — Encounter (HOSPITAL_COMMUNITY): Payer: Self-pay | Admitting: Emergency Medicine

## 2020-02-22 DIAGNOSIS — R5383 Other fatigue: Secondary | ICD-10-CM | POA: Insufficient documentation

## 2020-02-22 DIAGNOSIS — J020 Streptococcal pharyngitis: Secondary | ICD-10-CM | POA: Insufficient documentation

## 2020-02-22 DIAGNOSIS — F17210 Nicotine dependence, cigarettes, uncomplicated: Secondary | ICD-10-CM | POA: Insufficient documentation

## 2020-02-22 DIAGNOSIS — M7918 Myalgia, other site: Secondary | ICD-10-CM | POA: Insufficient documentation

## 2020-02-22 LAB — GROUP A STREP BY PCR: Group A Strep by PCR: DETECTED — AB

## 2020-02-22 MED ORDER — LIDOCAINE VISCOUS HCL 2 % MT SOLN
15.0000 mL | Freq: Once | OROMUCOSAL | Status: AC
  Administered 2020-02-22: 15 mL via OROMUCOSAL
  Filled 2020-02-22: qty 15

## 2020-02-22 MED ORDER — PENICILLIN G BENZATHINE 1200000 UNIT/2ML IM SUSP
1.2000 10*6.[IU] | Freq: Once | INTRAMUSCULAR | Status: AC
Start: 2020-02-22 — End: 2020-02-22
  Administered 2020-02-22: 1.2 10*6.[IU] via INTRAMUSCULAR
  Filled 2020-02-22: qty 2

## 2020-02-22 NOTE — Discharge Instructions (Signed)
Please rest and drink plenty of fluids You are infectious for 24 hours after receiving antibiotics here so please quarantine until tomorrow Take Tylenol or Ibuprofen for fever, pain, aches

## 2020-02-22 NOTE — ED Provider Notes (Signed)
[facility] EMERGENCY DEPARTMENT Provider Note   CSN: [id number] Arrival date & time: 02/22/20  0076     History Chief Complaint  Patient presents with  . Sore Throat    William Allison is a 29 y.o. male who presents with a sore throat. He states it started yesterday. He reports associated fatigue, aches, and his glands feel swollen. He denies headache, runny nose, congestion, ear pain, cough, N/V. He has tried salt water gargles and eating soft foods. He has not had his COVID vaccine.    HPI     Past Medical History:  Diagnosis Date  . ADD (attention deficit disorder)   . Constipation 05/30/12   [facility], [facility]; Donnatal q.i.d. Follow up in 4 weeks.  Marland Kitchen GERD (gastroesophageal reflux disease)     Patient Active Problem List   Diagnosis Date Noted  . Acute appendicitis with localized peritonitis 12/27/2016    Past Surgical History:  Procedure Laterality Date  . HAND SURGERY     for fracture  . LAPAROSCOPIC APPENDECTOMY N/A 12/26/2016   Procedure: APPENDECTOMY LAPAROSCOPIC;  Surgeon: Glenna Fellows, MD;  Location: [facility];  Service: General;  Laterality: N/A;  . TYMPANOSTOMY TUBE PLACEMENT         No family history on file.  Social History   Tobacco Use  . Smoking status: Current Every Day Smoker    Packs/day: 0.50    Types: Cigarettes  . Smokeless tobacco: Never Used  Substance Use Topics  . Alcohol use: Yes    Comment: every day  . Drug use: Yes    Types: Marijuana, Cocaine    Comment: daily    Home Medications Prior to Admission medications   Medication Sig Start Date End Date Taking? Authorizing Provider  amphetamine-dextroamphetamine (ADDERALL) 20 MG tablet Take 20 mg by mouth daily.  12/11/16   [provider]  diclofenac (VOLTAREN) 50 MG EC tablet Take 1 tablet (50 mg total) by mouth 2 (two) times daily. 09/24/17   Janne Napoleon, NP  HYDROcodone-acetaminophen (NORCO/VICODIN) 5-325 MG tablet Take 1 tablet  by mouth every 4 (four) hours as needed for moderate pain. 12/30/19 12/29/20  Elson Areas, PA-C  naproxen (NAPROSYN) 500 MG tablet Take 1 tablet (500 mg total) by mouth 2 (two) times daily. Patient not taking: Reported on 12/26/2016 07/26/16   Eyvonne Mechanic, PA-C  oxyCODONE (OXY IR/ROXICODONE) 5 MG immediate release tablet Take 1 tablet (5 mg total) by mouth every 4 (four) hours as needed for severe pain. 12/27/16   Meuth, Brooke A, PA-C  sulfamethoxazole-trimethoprim (BACTRIM DS) 800-160 MG tablet Take 1 tablet by mouth 2 (two) times daily. 12/30/19   Elson Areas, PA-C    Allergies    Patient has no known allergies.  Review of Systems   Review of Systems  Constitutional: Positive for fatigue. Negative for chills and fever.  HENT: Positive for sore throat, trouble swallowing (painful) and voice change. Negative for congestion, ear pain and rhinorrhea.   Respiratory: Negative for cough and shortness of breath.   Gastrointestinal: Negative for nausea and vomiting.    Physical Exam Updated Vital Signs BP 119/87 (BP Location: Left Arm)   Pulse (!) 120   Temp 99.2 F (37.3 C) (Oral)   Resp 14   Ht 6\' 4"  (1.93 m)   Wt 93 kg   SpO2 97%   BMI 24.95 kg/m   Physical Exam Vitals and nursing note reviewed.  Constitutional:  General: He is not in acute distress.    Appearance: Normal appearance. He is well-developed. He is not ill-appearing.  HENT:     Head: Normocephalic and atraumatic.     Mouth/Throat:     Lips: Pink.     Mouth: Mucous membranes are moist.     Pharynx: Posterior oropharyngeal erythema present. No pharyngeal swelling, oropharyngeal exudate or uvula swelling.     Tonsils: No tonsillar abscesses.  Eyes:     General: No scleral icterus.       Right eye: No discharge.        Left eye: No discharge.     Conjunctiva/sclera: Conjunctivae normal.     Pupils: Pupils are equal, round, and reactive to light.  Cardiovascular:     Rate and Rhythm: Normal rate.    Pulmonary:     Effort: Pulmonary effort is normal. No respiratory distress.  Abdominal:     General: There is no distension.  Musculoskeletal:     Cervical back: Normal range of motion.  Skin:    General: Skin is warm and dry.  Neurological:     Mental Status: He is alert and oriented to person, place, and time.  Psychiatric:        Behavior: Behavior normal.     ED Results / Procedures / Treatments   Labs (all labs ordered are listed, but only abnormal results are displayed) Labs Reviewed  GROUP A STREP BY PCR - Abnormal; Notable for the following components:      Result Value   Group A Strep by PCR DETECTED (*)    All other components within normal limits    EKG None  Radiology No results found.  Procedures Procedures (including critical care time)  Medications Ordered in ED Medications  penicillin g benzathine (BICILLIN LA) 1200000 UNIT/2ML injection 1.2 Million Units (has no administration in time range)  lidocaine (XYLOCAINE) 2 % viscous mouth solution 15 mL (15 mLs Mouth/Throat Given 02/22/20 1041)    ED Course  I have reviewed the triage vital signs and the nursing notes.  Pertinent labs & imaging results that were available during my care of the patient were reviewed by me and considered in my medical decision making (see chart for details).  29 year old with sore throat. HR is elevated here. He has pharyngeal erythema on exam without findings concerning for deep space infection. Strep is positive. IM PCN given along with work note. Supportive care discussed.  MDM Rules/Calculators/A&P                       Final Clinical Impression(s) / ED Diagnoses Final diagnoses:  Strep pharyngitis    Rx / DC Orders ED Discharge Orders    None       Recardo Evangelist, PA-C 02/22/20 1202    Carmin Muskrat, MD 02/22/20 1531

## 2020-02-22 NOTE — ED Triage Notes (Signed)
Pt state, Ive had a sore throat since yesterday. My glands are swollen too.

## 2022-07-27 ENCOUNTER — Other Ambulatory Visit (HOSPITAL_BASED_OUTPATIENT_CLINIC_OR_DEPARTMENT_OTHER): Payer: Self-pay

## 2022-07-27 MED ORDER — AMPHETAMINE-DEXTROAMPHETAMINE 30 MG PO TABS
30.0000 mg | ORAL_TABLET | Freq: Two times a day (BID) | ORAL | 0 refills | Status: DC
  Filled 2022-08-29: qty 60, 30d supply, fill #0

## 2022-07-28 ENCOUNTER — Other Ambulatory Visit (HOSPITAL_BASED_OUTPATIENT_CLINIC_OR_DEPARTMENT_OTHER): Payer: Self-pay

## 2022-07-28 MED ORDER — AMPHETAMINE-DEXTROAMPHETAMINE 30 MG PO TABS
ORAL_TABLET | ORAL | 0 refills | Status: DC
  Filled 2022-07-28: qty 60, 30d supply, fill #0

## 2022-08-29 ENCOUNTER — Other Ambulatory Visit (HOSPITAL_BASED_OUTPATIENT_CLINIC_OR_DEPARTMENT_OTHER): Payer: Self-pay

## 2022-09-20 ENCOUNTER — Other Ambulatory Visit (HOSPITAL_BASED_OUTPATIENT_CLINIC_OR_DEPARTMENT_OTHER): Payer: Self-pay

## 2022-09-20 MED ORDER — AMPHETAMINE-DEXTROAMPHETAMINE 30 MG PO TABS
30.0000 mg | ORAL_TABLET | Freq: Two times a day (BID) | ORAL | 0 refills | Status: DC
  Filled 2022-09-21 – 2022-09-22 (×2): qty 60, 30d supply, fill #0

## 2022-09-21 ENCOUNTER — Other Ambulatory Visit (HOSPITAL_BASED_OUTPATIENT_CLINIC_OR_DEPARTMENT_OTHER): Payer: Self-pay

## 2022-09-22 ENCOUNTER — Other Ambulatory Visit (HOSPITAL_BASED_OUTPATIENT_CLINIC_OR_DEPARTMENT_OTHER): Payer: Self-pay

## 2022-09-27 ENCOUNTER — Other Ambulatory Visit (HOSPITAL_BASED_OUTPATIENT_CLINIC_OR_DEPARTMENT_OTHER): Payer: Self-pay

## 2022-10-24 ENCOUNTER — Other Ambulatory Visit (HOSPITAL_BASED_OUTPATIENT_CLINIC_OR_DEPARTMENT_OTHER): Payer: Self-pay

## 2022-10-24 MED ORDER — AMPHETAMINE-DEXTROAMPHETAMINE 30 MG PO TABS
ORAL_TABLET | ORAL | 0 refills | Status: DC
  Filled 2022-10-24: qty 60, 30d supply, fill #0

## 2022-10-25 ENCOUNTER — Other Ambulatory Visit (HOSPITAL_BASED_OUTPATIENT_CLINIC_OR_DEPARTMENT_OTHER): Payer: Self-pay

## 2022-11-22 ENCOUNTER — Other Ambulatory Visit (HOSPITAL_BASED_OUTPATIENT_CLINIC_OR_DEPARTMENT_OTHER): Payer: Self-pay

## 2022-11-22 MED ORDER — AMPHETAMINE-DEXTROAMPHETAMINE 30 MG PO TABS
ORAL_TABLET | ORAL | 0 refills | Status: DC
  Filled 2022-11-22: qty 60, 30d supply, fill #0

## 2022-12-18 ENCOUNTER — Other Ambulatory Visit (HOSPITAL_BASED_OUTPATIENT_CLINIC_OR_DEPARTMENT_OTHER): Payer: Self-pay

## 2022-12-18 MED ORDER — AMPHETAMINE-DEXTROAMPHETAMINE 30 MG PO TABS
30.0000 mg | ORAL_TABLET | Freq: Two times a day (BID) | ORAL | 0 refills | Status: DC
  Filled 2022-12-20: qty 60, 30d supply, fill #0

## 2022-12-20 ENCOUNTER — Other Ambulatory Visit (HOSPITAL_BASED_OUTPATIENT_CLINIC_OR_DEPARTMENT_OTHER): Payer: Self-pay

## 2022-12-21 ENCOUNTER — Other Ambulatory Visit (HOSPITAL_BASED_OUTPATIENT_CLINIC_OR_DEPARTMENT_OTHER): Payer: Self-pay

## 2023-01-23 ENCOUNTER — Other Ambulatory Visit (HOSPITAL_BASED_OUTPATIENT_CLINIC_OR_DEPARTMENT_OTHER): Payer: Self-pay

## 2023-01-23 MED ORDER — CLONAZEPAM 1 MG PO TABS
0.5000 mg | ORAL_TABLET | Freq: Every day | ORAL | 5 refills | Status: DC
  Filled 2023-01-23: qty 30, 30d supply, fill #0
  Filled 2023-02-20: qty 30, 30d supply, fill #1
  Filled 2023-03-23: qty 30, 30d supply, fill #2
  Filled 2023-04-22: qty 30, 30d supply, fill #3
  Filled 2023-05-23: qty 30, 30d supply, fill #4
  Filled 2023-06-26 – 2023-07-10 (×2): qty 30, 30d supply, fill #5

## 2023-01-23 MED ORDER — AMPHETAMINE-DEXTROAMPHETAMINE 30 MG PO TABS
30.0000 mg | ORAL_TABLET | Freq: Two times a day (BID) | ORAL | 0 refills | Status: DC
  Filled 2023-01-23: qty 60, 30d supply, fill #0

## 2023-01-23 MED ORDER — AMPHETAMINE-DEXTROAMPHETAMINE 30 MG PO TABS
30.0000 mg | ORAL_TABLET | Freq: Two times a day (BID) | ORAL | 0 refills | Status: DC
  Filled 2023-06-21: qty 60, 30d supply, fill #0

## 2023-01-23 MED ORDER — AMPHETAMINE-DEXTROAMPHETAMINE 30 MG PO TABS
30.0000 mg | ORAL_TABLET | Freq: Two times a day (BID) | ORAL | 0 refills | Status: DC
  Filled ????-??-??: fill #0

## 2023-02-20 ENCOUNTER — Other Ambulatory Visit: Payer: Self-pay

## 2023-02-20 ENCOUNTER — Other Ambulatory Visit (HOSPITAL_BASED_OUTPATIENT_CLINIC_OR_DEPARTMENT_OTHER): Payer: Self-pay

## 2023-02-20 MED ORDER — AMPHETAMINE-DEXTROAMPHETAMINE 30 MG PO TABS
30.0000 mg | ORAL_TABLET | Freq: Two times a day (BID) | ORAL | 0 refills | Status: DC
  Filled 2023-02-20: qty 60, 30d supply, fill #0

## 2023-03-23 ENCOUNTER — Other Ambulatory Visit (HOSPITAL_BASED_OUTPATIENT_CLINIC_OR_DEPARTMENT_OTHER): Payer: Self-pay

## 2023-03-23 MED ORDER — AMPHETAMINE-DEXTROAMPHETAMINE 30 MG PO TABS
30.0000 mg | ORAL_TABLET | Freq: Two times a day (BID) | ORAL | 0 refills | Status: DC
  Filled 2023-03-23: qty 60, 30d supply, fill #0

## 2023-04-20 ENCOUNTER — Other Ambulatory Visit (HOSPITAL_BASED_OUTPATIENT_CLINIC_OR_DEPARTMENT_OTHER): Payer: Self-pay

## 2023-04-20 MED ORDER — AMPHETAMINE-DEXTROAMPHETAMINE 30 MG PO TABS
30.0000 mg | ORAL_TABLET | Freq: Two times a day (BID) | ORAL | 0 refills | Status: DC
  Filled 2023-04-20: qty 60, 30d supply, fill #0

## 2023-04-21 ENCOUNTER — Other Ambulatory Visit (HOSPITAL_BASED_OUTPATIENT_CLINIC_OR_DEPARTMENT_OTHER): Payer: Self-pay

## 2023-04-22 ENCOUNTER — Other Ambulatory Visit (HOSPITAL_BASED_OUTPATIENT_CLINIC_OR_DEPARTMENT_OTHER): Payer: Self-pay

## 2023-05-23 ENCOUNTER — Other Ambulatory Visit (HOSPITAL_BASED_OUTPATIENT_CLINIC_OR_DEPARTMENT_OTHER): Payer: Self-pay

## 2023-05-23 MED ORDER — AMPHETAMINE-DEXTROAMPHETAMINE 30 MG PO TABS
30.0000 mg | ORAL_TABLET | Freq: Two times a day (BID) | ORAL | 0 refills | Status: DC
  Filled 2023-05-23: qty 60, 30d supply, fill #0

## 2023-06-21 ENCOUNTER — Other Ambulatory Visit: Payer: Self-pay

## 2023-06-21 ENCOUNTER — Other Ambulatory Visit (HOSPITAL_BASED_OUTPATIENT_CLINIC_OR_DEPARTMENT_OTHER): Payer: Self-pay

## 2023-06-26 ENCOUNTER — Other Ambulatory Visit (HOSPITAL_BASED_OUTPATIENT_CLINIC_OR_DEPARTMENT_OTHER): Payer: Self-pay

## 2023-07-06 ENCOUNTER — Other Ambulatory Visit (HOSPITAL_BASED_OUTPATIENT_CLINIC_OR_DEPARTMENT_OTHER): Payer: Self-pay

## 2023-07-10 ENCOUNTER — Other Ambulatory Visit (HOSPITAL_BASED_OUTPATIENT_CLINIC_OR_DEPARTMENT_OTHER): Payer: Self-pay

## 2023-07-20 ENCOUNTER — Other Ambulatory Visit (HOSPITAL_BASED_OUTPATIENT_CLINIC_OR_DEPARTMENT_OTHER): Payer: Self-pay

## 2023-07-20 MED ORDER — AMPHETAMINE-DEXTROAMPHETAMINE 30 MG PO TABS
30.0000 mg | ORAL_TABLET | Freq: Two times a day (BID) | ORAL | 0 refills | Status: DC
  Filled 2023-07-20: qty 60, 30d supply, fill #0

## 2023-07-20 MED ORDER — CLONAZEPAM 1 MG PO TABS
1.0000 mg | ORAL_TABLET | Freq: Every day | ORAL | 5 refills | Status: DC
  Filled 2023-07-20: qty 30, 30d supply, fill #0

## 2023-08-08 ENCOUNTER — Emergency Department (HOSPITAL_COMMUNITY)
Admission: EM | Admit: 2023-08-08 | Discharge: 2023-08-09 | Disposition: A | Discharge status: Other Acute Inpatient Hospital | Payer: No Payment, Other | Attending: Emergency Medicine | Admitting: Emergency Medicine

## 2023-08-08 ENCOUNTER — Other Ambulatory Visit: Payer: Self-pay

## 2023-08-08 DIAGNOSIS — F1020 Alcohol dependence, uncomplicated: Secondary | ICD-10-CM | POA: Diagnosis present

## 2023-08-08 DIAGNOSIS — F1220 Cannabis dependence, uncomplicated: Secondary | ICD-10-CM | POA: Diagnosis present

## 2023-08-08 DIAGNOSIS — F17210 Nicotine dependence, cigarettes, uncomplicated: Secondary | ICD-10-CM | POA: Insufficient documentation

## 2023-08-08 DIAGNOSIS — F19950 Other psychoactive substance use, unspecified with psychoactive substance-induced psychotic disorder with delusions: Secondary | ICD-10-CM | POA: Diagnosis present

## 2023-08-08 DIAGNOSIS — F22 Delusional disorders: Secondary | ICD-10-CM | POA: Insufficient documentation

## 2023-08-08 DIAGNOSIS — F19959 Other psychoactive substance use, unspecified with psychoactive substance-induced psychotic disorder, unspecified: Secondary | ICD-10-CM | POA: Insufficient documentation

## 2023-08-08 DIAGNOSIS — F1290 Cannabis use, unspecified, uncomplicated: Secondary | ICD-10-CM | POA: Insufficient documentation

## 2023-08-08 DIAGNOSIS — F32A Depression, unspecified: Secondary | ICD-10-CM | POA: Diagnosis present

## 2023-08-08 DIAGNOSIS — F1990 Other psychoactive substance use, unspecified, uncomplicated: Secondary | ICD-10-CM

## 2023-08-08 DIAGNOSIS — Z79899 Other long term (current) drug therapy: Secondary | ICD-10-CM | POA: Insufficient documentation

## 2023-08-08 DIAGNOSIS — F1520 Other stimulant dependence, uncomplicated: Secondary | ICD-10-CM | POA: Diagnosis present

## 2023-08-08 DIAGNOSIS — F1310 Sedative, hypnotic or anxiolytic abuse, uncomplicated: Secondary | ICD-10-CM | POA: Diagnosis present

## 2023-08-08 DIAGNOSIS — F1914 Other psychoactive substance abuse with psychoactive substance-induced mood disorder: Secondary | ICD-10-CM | POA: Insufficient documentation

## 2023-08-08 DIAGNOSIS — F1510 Other stimulant abuse, uncomplicated: Secondary | ICD-10-CM | POA: Diagnosis present

## 2023-08-08 DIAGNOSIS — F1010 Alcohol abuse, uncomplicated: Secondary | ICD-10-CM | POA: Insufficient documentation

## 2023-08-08 DIAGNOSIS — F1590 Other stimulant use, unspecified, uncomplicated: Secondary | ICD-10-CM | POA: Diagnosis present

## 2023-08-08 LAB — COMPREHENSIVE METABOLIC PANEL
ALT: 21 U/L (ref 0–44)
AST: 27 U/L (ref 15–41)
Albumin: 4.5 g/dL (ref 3.5–5.0)
Alkaline Phosphatase: 80 U/L (ref 38–126)
Anion gap: 15 (ref 5–15)
BUN: 19 mg/dL (ref 6–20)
CO2: 21 mmol/L — ABNORMAL LOW (ref 22–32)
Calcium: 9.5 mg/dL (ref 8.9–10.3)
Chloride: 98 mmol/L (ref 98–111)
Creatinine, Ser: 1.06 mg/dL (ref 0.61–1.24)
GFR, Estimated: >60 mL/min (ref >60)
Glucose, Bld: 108 mg/dL — ABNORMAL HIGH (ref 70–99)
Potassium: 3.4 mmol/L — ABNORMAL LOW (ref 3.5–5.1)
Sodium: 134 mmol/L — ABNORMAL LOW (ref 135–145)
Total Bilirubin: 1.6 mg/dL — ABNORMAL HIGH (ref <1.2)
Total Protein: 8.4 g/dL — ABNORMAL HIGH (ref 6.5–8.1)

## 2023-08-08 LAB — CBC
HCT: 45.7 % (ref 39.0–52.0)
Hemoglobin: 15.9 g/dL (ref 13.0–17.0)
MCH: 33.1 pg (ref 26.0–34.0)
MCHC: 34.8 g/dL (ref 30.0–36.0)
MCV: 95.2 fL (ref 80.0–100.0)
Platelets: 299 10*3/uL (ref 150–400)
RBC: 4.8 MIL/uL (ref 4.22–5.81)
RDW: 11.7 % (ref 11.5–15.5)
WBC: 13.8 10*3/uL — ABNORMAL HIGH (ref 4.0–10.5)
nRBC: 0 % (ref 0.0–0.2)

## 2023-08-08 LAB — ETHANOL: Alcohol, Ethyl (B): <10 mg/dL (ref <10)

## 2023-08-08 MED ORDER — HALOPERIDOL LACTATE 5 MG/ML IJ SOLN
5.0000 mg | Freq: Once | INTRAMUSCULAR | Status: AC
  Administered 2023-08-08: 5 mg via INTRAMUSCULAR
  Filled 2023-08-08: qty 1

## 2023-08-08 MED ORDER — DIPHENHYDRAMINE HCL 50 MG/ML IJ SOLN
25.0000 mg | Freq: Once | INTRAMUSCULAR | Status: AC
  Administered 2023-08-08: 25 mg via INTRAMUSCULAR
  Filled 2023-08-08: qty 1

## 2023-08-08 NOTE — ED Provider Notes (Signed)
[place] EMERGENCY DEPARTMENT AT [facility] Provider Note   CSN: [id number] Arrival date & time: 08/08/23  2018     History {Add pertinent medical, surgical, social history, OB history to HPI:1} Chief Complaint  Patient presents with   Paranoid   Drug Problem    William Allison is a 32 y.o. male.   Drug Problem  32 year old male history of GERD, ADD presenting for paranoia.  Patient presents via EMS.  He reported to EMS that he used meth for the last 4 days.  He states he is very paranoid, he states people are chasing him and trying to kill him.  He feels like people are listening to him through the walls are trying to harm him.  He does not feel safe here.  He denies any drug or alcohol use to me.  He denies any physical concerns or pain.  Denies any SI or HI but is afraid other people are trying to harm him.     Home Medications Prior to Admission medications   Medication Sig Start Date End Date Taking? Authorizing Provider  amphetamine-dextroamphetamine (ADDERALL) 20 MG tablet Take 20 mg by mouth daily.  12/11/16   [provider]  amphetamine-dextroamphetamine (ADDERALL) 30 MG tablet Take one pill twice per day- there are no more refills until you have an appointment 07/28/22     amphetamine-dextroamphetamine (ADDERALL) 30 MG tablet Take 1 tablet by mouth 2 (two) times daily. 12/18/22     amphetamine-dextroamphetamine (ADDERALL) 30 MG tablet Take 1 tablet by mouth 2 (two) times daily. 02/21/23     amphetamine-dextroamphetamine (ADDERALL) 30 MG tablet Take 1 tablet by mouth 2 (two) times daily. 01/23/23     amphetamine-dextroamphetamine (ADDERALL) 30 MG tablet Take 1 tablet by mouth 2 (two) times daily 03/22/23     amphetamine-dextroamphetamine (ADDERALL) 30 MG tablet Take 1 tablet by mouth twice daily. 02/20/23     amphetamine-dextroamphetamine (ADDERALL) 30 MG tablet Take 1 tablet by mouth 2 (two) times daily. 03/23/23     amphetamine-dextroamphetamine (ADDERALL)  30 MG tablet Take 1 tablet by mouth 2 (two) times daily. 04/20/23     amphetamine-dextroamphetamine (ADDERALL) 30 MG tablet Take 1 tablet by mouth 2 (two) times daily. 05/23/23     amphetamine-dextroamphetamine (ADDERALL) 30 MG tablet Take 1 tablet by mouth 2 (two) times daily. 07/20/23     clonazePAM (KLONOPIN) 1 MG tablet Take half to 1 tablet (1 mg total) by mouth daily. 07/20/23     diclofenac (VOLTAREN) 50 MG EC tablet Take 1 tablet (50 mg total) by mouth 2 (two) times daily. 09/24/17   Janne Napoleon, NP  naproxen (NAPROSYN) 500 MG tablet Take 1 tablet (500 mg total) by mouth 2 (two) times daily. Patient not taking: Reported on 12/26/2016 07/26/16   Eyvonne Mechanic, PA-C  oxyCODONE (OXY IR/ROXICODONE) 5 MG immediate release tablet Take 1 tablet (5 mg total) by mouth every 4 (four) hours as needed for severe pain. 12/27/16   Meuth, Brooke A, PA-C  sulfamethoxazole-trimethoprim (BACTRIM DS) 800-160 MG tablet Take 1 tablet by mouth 2 (two) times daily. 12/30/19   Elson Areas, PA-C      Allergies    Patient has no known allergies.    Review of Systems   Review of Systems  Physical Exam Updated Vital Signs BP (!) 150/106 (BP Location: Left Arm)   Pulse (!) 113   Temp 98.3 F (36.8 C) (Oral)   Resp 18   SpO2 100%  Physical Exam  ED Results / Procedures / Treatments   Labs (all labs ordered are listed, but only abnormal results are displayed) Labs Reviewed  CBC - Abnormal; Notable for the following components:      Result Value   WBC 13.8 (*)    All other components within normal limits  COMPREHENSIVE METABOLIC PANEL  ETHANOL  RAPID URINE DRUG SCREEN, HOSP PERFORMED  RAPID URINE DRUG SCREEN, HOSP PERFORMED  ACETAMINOPHEN LEVEL  SALICYLATE LEVEL    EKG None  Radiology No results found.  Procedures Procedures  {Document cardiac monitor, telemetry assessment procedure when appropriate:1}  Medications Ordered in ED Medications - No data to display  ED Course/ Medical  Decision Making/ A&P Clinical Course as of 08/08/23 2210  Tue Aug 08, 2023  2203 Paranoid, delusions of persecution, being IVCd [CP]    Clinical Course User Index [CP] Prosperi, Christian H, PA-C   {   Click here for ABCD2, HEART and other calculatorsREFRESH Note before signing :1}                              Medical Decision Making Amount and/or Complexity of Data Reviewed Labs: ordered.   ***  {Document critical care time when appropriate:1} {Document review of labs and clinical decision tools ie heart score, Chads2Vasc2 etc:1}  {Document your independent review of radiology images, and any outside records:1} {Document your discussion with family members, caretakers, and with consultants:1} {Document social determinants of health affecting pt's care:1} {Document your decision making why or why not admission, treatments were needed:1} Final Clinical Impression(s) / ED Diagnoses Final diagnoses:  None    Rx / DC Orders ED Discharge Orders     None

## 2023-08-08 NOTE — ED Notes (Signed)
Patient very paranoid, yelling " they have a gun!". RN tried to calm patient down.

## 2023-08-08 NOTE — ED Notes (Signed)
Pt stated he doesn't need to use the restroom at the moment

## 2023-08-08 NOTE — ED Triage Notes (Signed)
Patient BIB EMS c/o Meth Use x4 days. Patient very paranoid stating he is been chase by 17 people and tried to kill him. Patient report unable to sleep for 4-5 days.

## 2023-08-08 NOTE — ED Notes (Addendum)
Pt changed out into purple scrubs and items placed in patient belongings bags. Pt belongings in cabinet at nurses desk in hall 1-8.

## 2023-08-09 ENCOUNTER — Other Ambulatory Visit (HOSPITAL_COMMUNITY)
Admission: EM | Admit: 2023-08-09 | Discharge: 2023-08-12 | Disposition: A | Discharge status: Home / Self Care | Payer: No Payment, Other | Attending: Psychiatry | Admitting: Psychiatry

## 2023-08-09 ENCOUNTER — Encounter (HOSPITAL_COMMUNITY): Payer: Self-pay | Admitting: Psychiatric/Mental Health

## 2023-08-09 DIAGNOSIS — F1520 Other stimulant dependence, uncomplicated: Secondary | ICD-10-CM | POA: Diagnosis not present

## 2023-08-09 DIAGNOSIS — F17210 Nicotine dependence, cigarettes, uncomplicated: Secondary | ICD-10-CM | POA: Insufficient documentation

## 2023-08-09 DIAGNOSIS — F32A Depression, unspecified: Secondary | ICD-10-CM

## 2023-08-09 DIAGNOSIS — F1290 Cannabis use, unspecified, uncomplicated: Secondary | ICD-10-CM

## 2023-08-09 DIAGNOSIS — F1510 Other stimulant abuse, uncomplicated: Secondary | ICD-10-CM | POA: Diagnosis present

## 2023-08-09 DIAGNOSIS — F1910 Other psychoactive substance abuse, uncomplicated: Secondary | ICD-10-CM | POA: Diagnosis present

## 2023-08-09 DIAGNOSIS — F19959 Other psychoactive substance use, unspecified with psychoactive substance-induced psychotic disorder, unspecified: Secondary | ICD-10-CM | POA: Diagnosis not present

## 2023-08-09 DIAGNOSIS — F1020 Alcohol dependence, uncomplicated: Secondary | ICD-10-CM

## 2023-08-09 DIAGNOSIS — F1310 Sedative, hypnotic or anxiolytic abuse, uncomplicated: Secondary | ICD-10-CM

## 2023-08-09 DIAGNOSIS — F19950 Other psychoactive substance use, unspecified with psychoactive substance-induced psychotic disorder with delusions: Secondary | ICD-10-CM | POA: Diagnosis not present

## 2023-08-09 DIAGNOSIS — F1590 Other stimulant use, unspecified, uncomplicated: Secondary | ICD-10-CM

## 2023-08-09 DIAGNOSIS — F1220 Cannabis dependence, uncomplicated: Secondary | ICD-10-CM | POA: Diagnosis present

## 2023-08-09 LAB — RAPID URINE DRUG SCREEN, HOSP PERFORMED
Amphetamines: POSITIVE — AB
Barbiturates: NOT DETECTED
Benzodiazepines: NOT DETECTED
Cocaine: POSITIVE — AB
Opiates: NOT DETECTED
Tetrahydrocannabinol: POSITIVE — AB

## 2023-08-09 LAB — ACETAMINOPHEN LEVEL: Acetaminophen (Tylenol), Serum: <10 ug/mL — ABNORMAL LOW (ref 10–30)

## 2023-08-09 LAB — SALICYLATE LEVEL: Salicylate Lvl: <7 mg/dL — ABNORMAL LOW (ref 7.0–30.0)

## 2023-08-09 MED ORDER — TRAZODONE HCL 50 MG PO TABS
50.0000 mg | ORAL_TABLET | Freq: Every evening | ORAL | Status: DC | PRN
  Administered 2023-08-09 – 2023-08-11 (×3): 50 mg via ORAL
  Filled 2023-08-09 (×3): qty 1

## 2023-08-09 MED ORDER — OLANZAPINE 5 MG PO TABS
5.0000 mg | ORAL_TABLET | Freq: Every day | ORAL | Status: DC
  Administered 2023-08-10 – 2023-08-12 (×3): 5 mg via ORAL
  Filled 2023-08-09 (×3): qty 1

## 2023-08-09 MED ORDER — HYDROXYZINE HCL 25 MG PO TABS
25.0000 mg | ORAL_TABLET | Freq: Three times a day (TID) | ORAL | Status: DC | PRN
  Administered 2023-08-09: 25 mg via ORAL
  Filled 2023-08-09: qty 1

## 2023-08-09 MED ORDER — OLANZAPINE 5 MG PO TABS
5.0000 mg | ORAL_TABLET | Freq: Every day | ORAL | Status: DC
  Administered 2023-08-09: 5 mg via ORAL
  Filled 2023-08-09: qty 1

## 2023-08-09 MED ORDER — ALUM & MAG HYDROXIDE-SIMETH 200-200-20 MG/5ML PO SUSP: 30.0000 mL | ORAL | Status: DC | PRN

## 2023-08-09 MED ORDER — OLANZAPINE 10 MG IM SOLR: 10.0000 mg | Freq: Once | INTRAMUSCULAR | Status: DC | PRN

## 2023-08-09 MED ORDER — MAGNESIUM HYDROXIDE 400 MG/5ML PO SUSP: 30.0000 mL | Freq: Every day | ORAL | Status: DC | PRN

## 2023-08-09 MED ORDER — ACETAMINOPHEN 325 MG PO TABS: 650.0000 mg | ORAL_TABLET | Freq: Four times a day (QID) | ORAL | Status: DC | PRN

## 2023-08-09 NOTE — ED Notes (Signed)
[facility] called for transport of an IVC pt to [facility].

## 2023-08-09 NOTE — ED Notes (Signed)
Fluids encouraged after pt report unable to void, pitcher of water provided and urine specimen cup at bedside.

## 2023-08-09 NOTE — ED Notes (Signed)
[facility] spoke with pt to assess his current level of drug induced paranoia and his willingness to be admitted to [facility]. Pt reports feeling much better and is open to [facility]. Pt reports a history of alcoholism, use of 4 40 ounce containers of alcohol daily for years. Pt reports a family history of alcoholism (biological father).   Pt reports this recent binge of drug (meth, cocaine, THC) as more intense than in times past. PT is not sure what to attribute the increase in use to. PT has never accessed drug or alcohol treatment services before. Pt receives medication for ADHD from Mady Gemma, [place] at [facility]. Pt expressed interest making changes in his life including becoming sober.   Pt gave permission to speak with his girlfriend, Brenton Grills, who is aware of pt being in the ED and his struggle with drugs and alcohol.   Jacquelynn Cree, [facility]  08/09/23

## 2023-08-09 NOTE — BH Assessment (Signed)
This TTS consult will be completed by [facility]. [facility] Coordinator will communicate in established secure chat provider name and time of assessment. Thanks

## 2023-08-09 NOTE — Consult Note (Signed)
[facility] Consult Note  Patient Name: William Allison MRN: [id number] DOB: [birth year] DATE OF Consult: 08/09/2023  PRIMARY PSYCHIATRIC DIAGNOSES  1.  Substance Induced Psychotic Disorder 2. Depressive Disorder, Unspecified- R/O Substance Induced Mood Disorder 3.  Methamphetamine Use Disorder 4.  Alcohol Use Disorder 5. Cannabis Use Disorder 6. Benzodiazepine Abuse  RECOMMENDATIONS  Recommendations: Medication recommendations:  -- Start Zyprexa 5mg  po daily for mood and acute psychosis -- Start Hydroxyzine 25mg  po TID PRN for anxiety -- Utilize Zyprexa 10mg  PO/IM Q6H PRN for acute agitation   Non-Medication/therapeutic recommendations:  -- Patient agreeable to VOL psychiatric admission.  -- May allow IVC to expire at this time. I do not feel he meets criteria for IVC at this time for renewal as he is agreeable for admission. Also acute psychosis appears related to polysubstance use- overall demeanor seems to be improving with time. Although awaiting UDS at time of evaluation. -- Patient would benefit from dual diagnosis admission  -- If patient changes his mind regarding psychiatric admission, would recommend psychiatric follow-up to determine risk at that time and to determine if patient is able to safety plan  Is inpatient psychiatric hospitalization recommended for this patient?  -- Patient agreeable to VOL admission for worsening depression and substance use. Patient also with paranoia and delusions.   Communication: Treatment team members (and family members if applicable) who were involved in treatment/care discussions and planning, and with whom we spoke or engaged with via secure text/chat, include the following: Discussed with ED primary team and Dr. Bebe Shaggy.   Thank you for involving [place] in the care of this patient. If you have any additional questions or concerns, please call [phone] and ask for me or the provider on-call.  TELEPSYCHIATRY ATTESTATION & CONSENT  As  the provider for this telehealth consult, I attest that I verified the patient's identity using two separate identifiers, introduced myself to the patient, provided my credentials, disclosed my location, and performed this encounter via a HIPAA-compliant, real-time, face-to-face, two-way, interactive audio and video platform and with the full consent and agreement of the patient (or guardian as applicable.)  Patient physical location: ED in [facility]. Telehealth provider physical location: home office in state of [place] .  Video start time: 0443 (Central Time) Video end time: 0453 (Central Time)  IDENTIFYING DATA  William Allison is a 32 y.o. year-old male for whom a psychiatric consultation has been ordered by the primary provider. The patient was identified using two separate identifiers.  CHIEF COMPLAINT/REASON FOR CONSULT  Psychosis   HISTORY OF PRESENT ILLNESS (HPI)  The patient is a 32yo male who presented to the emergency department, via EMS, for concern of acute psychosis. Patient with paranoia, states people are chasing him and trying to kill him. Reports using meth 4 days ago. The primary team is asking psychiatry to assist with evaluation of acute psychosis. Patient was placed on IVC petition.  Awaiting UDS at time of consult   Patient evaluated 1:1 for psychiatric evaluation. Patient is calm and cooperative. Appears mildly suspicious, paranoid as he continues to scan the room and check the door. Patient states he is "really struggling" lately. States he hasn't slept in 4 days, hasn't been eating and believes people are after him. Patient states last night he was "just chilling and some people tried to jump me. I ran to this place and called police and these people followed me to the hospital". Patient believes these individuals are trying to harm him or  kill him. He believes they work in the hospital and are trying to gain access to him. Patient states he feels better now  that he is in a more secure area of the hospital.  Patient shares lately he has felt "really rough" and complains of depressive moods and feelings of helplessness, hopelessness, and worthlessness.  Denies suicidal and homicidal ideations. No past suicide attempts. He denies hallucinations at this time. Does not appear to be responding to internal stimuli, no thought blocking noted but again appears paranoid and scans the room. Patient still believes, this morning, that these individuals are after him and does not feel safe leaving the hospital.   Patient admits to recently polysubstance abuse. He reports drinking alcohol daily and usually drinks 40oz beers daily. Last drink was yesterday. Patient also admits to frequent marijuana use and last used methamphetamines 2-3 days ago. History of benzodiazepine and stimulant abuse sharing that he buys Xanax and Adderall off the street and last used 3 weeks ago. Denies history of drug detox/ rehabilitation admissions. He is currently homeless. Patient feels he needs to quit using drugs and improve his overall health.     PAST PSYCHIATRIC HISTORY  No past psychiatric hospitalizations. History of ADHD  No prescribed medications.  In the past attended [facility] and saw Karen Chafe who prescribed Klonopin, Adderall.  No past suicide attempts   Otherwise as per HPI above.  PAST MEDICAL HISTORY  Past Medical History:  Diagnosis Date   ADD (attention deficit disorder)    Constipation 05/30/12   [facility], [facility]; [facility] q.i.d. Follow up in 4 weeks.   GERD (gastroesophageal reflux disease)      HOME MEDICATIONS  Facility Ordered Medications  Medication   [COMPLETED] haloperidol lactate (HALDOL) injection 5 mg   [COMPLETED] diphenhydrAMINE (BENADRYL) injection 25 mg   PTA Medications  Medication Sig   naproxen (NAPROSYN) 500 MG tablet Take 1 tablet (500 mg total) by mouth 2 (two) times daily. (Patient not taking:  Reported on 12/26/2016)   amphetamine-dextroamphetamine (ADDERALL) 20 MG tablet Take 20 mg by mouth daily.    oxyCODONE (OXY IR/ROXICODONE) 5 MG immediate release tablet Take 1 tablet (5 mg total) by mouth every 4 (four) hours as needed for severe pain.   diclofenac (VOLTAREN) 50 MG EC tablet Take 1 tablet (50 mg total) by mouth 2 (two) times daily.   sulfamethoxazole-trimethoprim (BACTRIM DS) 800-160 MG tablet Take 1 tablet by mouth 2 (two) times daily.   amphetamine-dextroamphetamine (ADDERALL) 30 MG tablet Take one pill twice per day- there are no more refills until you have an appointment   amphetamine-dextroamphetamine (ADDERALL) 30 MG tablet Take 1 tablet by mouth 2 (two) times daily.   amphetamine-dextroamphetamine (ADDERALL) 30 MG tablet Take 1 tablet by mouth 2 (two) times daily.   amphetamine-dextroamphetamine (ADDERALL) 30 MG tablet Take 1 tablet by mouth 2 (two) times daily.   amphetamine-dextroamphetamine (ADDERALL) 30 MG tablet Take 1 tablet by mouth 2 (two) times daily   amphetamine-dextroamphetamine (ADDERALL) 30 MG tablet Take 1 tablet by mouth twice daily.   amphetamine-dextroamphetamine (ADDERALL) 30 MG tablet Take 1 tablet by mouth 2 (two) times daily.   amphetamine-dextroamphetamine (ADDERALL) 30 MG tablet Take 1 tablet by mouth 2 (two) times daily.   amphetamine-dextroamphetamine (ADDERALL) 30 MG tablet Take 1 tablet by mouth 2 (two) times daily.   amphetamine-dextroamphetamine (ADDERALL) 30 MG tablet Take 1 tablet by mouth 2 (two) times daily.   clonazePAM (KLONOPIN) 1 MG tablet Take half to  1 tablet (1 mg total) by mouth daily.     ALLERGIES  No Known Allergies  SOCIAL & SUBSTANCE USE HISTORY  Social History   Socioeconomic History   Marital status: Single    Spouse name: Not on file   Number of children: Not on file   Years of education: Not on file   Highest education level: Not on file  Occupational History   Not on file  Tobacco Use   Smoking status: Every  Day    Current packs/day: 0.50    Types: Cigarettes   Smokeless tobacco: Never  Substance and Sexual Activity   Alcohol use: Yes    Comment: every day   Drug use: Yes    Types: Marijuana, Cocaine    Comment: daily   Sexual activity: Not on file  Other Topics Concern   Not on file  Social History Narrative   Not on file   Social Determinants of Health   Financial Resource Strain: Not on file  Food Insecurity: Not on file  Transportation Needs: Not on file  Physical Activity: Not on file  Stress: Not on file  Social Connections: Not on file   Social History   Tobacco Use  Smoking Status Every Day   Current packs/day: 0.50   Types: Cigarettes  Smokeless Tobacco Never   Social History   Substance and Sexual Activity  Alcohol Use Yes   Comment: every day   Social History   Substance and Sexual Activity  Drug Use Yes   Types: Marijuana, Cocaine   Comment: daily    Additional pertinent information Homelessness, unemployed.  FAMILY HISTORY  History reviewed. No pertinent family history. Family Psychiatric History (if known):  Patient does not know at this time.    MENTAL STATUS EXAM (MSE)  Presentation  General Appearance: Appropriate for Environment Eye Contact:Fleeting Speech:Slow Speech Volume:Decreased Handedness:No data recorded  Mood and Affect  Mood:Dysphoric Affect:Constricted (suspicious)  Thought Process  Thought Processes:Disorganized Descriptions of Associations:Intact  Orientation:Full (Time, Place and Person)  Thought Content:Paranoid Ideation; Delusions  History of Schizophrenia/Schizoaffective disorder:No  Duration of Psychotic Symptoms:Less than six months  Hallucinations:Hallucinations: None  Ideas of Reference:Paranoia; Delusions  Suicidal Thoughts:Suicidal Thoughts: No  Homicidal Thoughts:Homicidal Thoughts: No   Sensorium  Memory:Recent Fair Judgment:Poor Insight:Poor  Executive Functions   Concentration:Fair Attention Span:Fair Recall:Fair Fund of Knowledge:Fair Language:Good  Psychomotor Activity  Psychomotor Activity:Psychomotor Activity: Normal  Assets  Assets:Communication Skills; Desire for Improvement; Physical Health  Sleep  Sleep:Sleep: Poor   VITALS  Blood pressure 113/63, pulse 71, temperature 98.3 F (36.8 C), temperature source Oral, resp. rate 15, SpO2 100%.  LABS  Admission on 08/08/2023  Component Date Value Ref Range Status   Sodium 08/08/2023 134 (L)  135 - 145 mmol/L Final   Potassium 08/08/2023 3.4 (L)  3.5 - 5.1 mmol/L Final   Chloride 08/08/2023 98  98 - 111 mmol/L Final   CO2 08/08/2023 21 (L)  22 - 32 mmol/L Final   Glucose, Bld 08/08/2023 108 (H)  70 - 99 mg/dL Final   Glucose reference range applies only to samples taken after fasting for at least 8 hours.   BUN 08/08/2023 19  6 - 20 mg/dL Final   Creatinine, Ser 08/08/2023 1.06  0.61 - 1.24 mg/dL Final   Calcium 16/07/9603 9.5  8.9 - 10.3 mg/dL Final   Total Protein 54/06/8118 8.4 (H)  6.5 - 8.1 g/dL Final   Albumin 14/78/2956 4.5  3.5 - 5.0 g/dL Final   AST 21/30/8657  27  15 - 41 U/L Final   ALT 08/08/2023 21  0 - 44 U/L Final   Alkaline Phosphatase 08/08/2023 80  38 - 126 U/L Final   Total Bilirubin 08/08/2023 1.6 (H)  <1.2 mg/dL Final   GFR, Estimated 08/08/2023 >60  >60 mL/min Final   Comment: (NOTE) Calculated using the CKD-EPI Creatinine Equation (2021)    Anion gap 08/08/2023 15  5 - 15 Final   Performed at [facility], [address] [place], [place] [address]   Alcohol, Ethyl (B) 08/08/2023 <10  <10 mg/dL Final   Comment: (NOTE) Lowest detectable limit for serum alcohol is 10 mg/dL.  For medical purposes only. Performed at [facility], [address], [place], [place] [address]    WBC 08/08/2023 13.8 (H)  4.0 - 10.5 K/uL Final   RBC 08/08/2023 4.80  4.22 - 5.81 MIL/uL Final   Hemoglobin 08/08/2023 15.9  13.0 - 17.0 g/dL  Final   HCT 09/81/1914 45.7  39.0 - 52.0 % Final   MCV 08/08/2023 95.2  80.0 - 100.0 fL Final   MCH 08/08/2023 33.1  26.0 - 34.0 pg Final   MCHC 08/08/2023 34.8  30.0 - 36.0 g/dL Final   RDW 78/29/5621 11.7  11.5 - 15.5 % Final   Platelets 08/08/2023 299  150 - 400 K/uL Final   nRBC 08/08/2023 0.0  0.0 - 0.2 % Final   Performed at [facility], [address] [place], [place] [address]   Acetaminophen (Tylenol), Serum 08/08/2023 <10 (L)  10 - 30 ug/mL Final   Comment: (NOTE) Therapeutic concentrations vary significantly. A range of 10-30 ug/mL  may be an effective concentration for many patients. However, some  are best treated at concentrations outside of this range. Acetaminophen concentrations >150 ug/mL at 4 hours after ingestion  and >50 ug/mL at 12 hours after ingestion are often associated with  toxic reactions.  Performed at [facility], [address] [place], [place] [address]    Salicylate Lvl 08/08/2023 <7.0 (L)  7.0 - 30.0 mg/dL Final   Performed at [facility], [address] [place], [place] [address]    PSYCHIATRIC REVIEW OF SYSTEMS (ROS)  ROS: Notable for the following relevant positive findings: Review of Systems  Psychiatric/Behavioral:  Positive for depression and substance abuse. Negative for suicidal ideas. The patient is nervous/anxious and has insomnia.     Additional findings:      Musculoskeletal: No abnormal movements observed      Gait & Station: Laying/Sitting      Pain Screening: Denies      Nutrition & Dental Concerns: Decrease in food intake and/or loss of appetite  RISK FORMULATION/ASSESSMENT  Is the patient experiencing any suicidal or homicidal ideations: No       Explain if yes:  Protective factors considered for safety management: access to care, willingness to receive treatment  Risk factors/concerns considered for safety management: paranoia, delusions Depression Substance  abuse/dependence Hopelessness Impulsivity Barriers to accessing treatment Male gender Unmarried  Is there a safety management plan with the patient and treatment team to minimize risk factors and promote protective factors: Yes           Explain: Patient currently in the ED, on IVC petition, medication management, inpatient psychiatric treatment.  Is crisis care placement or psychiatric hospitalization recommended: Yes     Based on my current evaluation and risk assessment, patient is determined at this time to be  at:  Moderate Risk  *RISK ASSESSMENT Risk assessment is a dynamic process; it is possible that this patient's condition, and risk level, may change. This should be re-evaluated and managed over time as appropriate. Please re-consult psychiatric consult services if additional assistance is needed in terms of risk assessment and management. If your team decides to discharge this patient, please advise the patient how to best access emergency psychiatric services, or to call 911, if their condition worsens or they feel unsafe in any way.   Assunta Gambles, NP Telepsychiatry Consult Services

## 2023-08-09 NOTE — ED Notes (Addendum)
TTS completed waiting provider recommendations.UDS obtained and sent to lab.

## 2023-08-09 NOTE — ED Provider Notes (Signed)
Labs are overall reassuring Vital signs appropriate.  Patient will now be seen by psychiatry    Zadie Rhine, MD 08/09/23 [phone]

## 2023-08-09 NOTE — ED Notes (Signed)
Pt was accepted to [facility]) Address: [address], [place], [place] [address] TODAY 08/09/2023; Bed Assignment Facility Based Crisis [facility])  DX:SI  Pt meets inpatient criteria per Joaquim Nam  Attending Physician will be Ancil Linsey  Report can be called to: [phone]

## 2023-08-09 NOTE — ED Notes (Addendum)
Attempted to call report - no answer at this time-  

## 2023-08-09 NOTE — Progress Notes (Signed)
Pt was accepted to [facility]) Address: [address], [place], [place] [address] TODAY 08/09/2023; Bed Assignment Facility Based Crisis [facility])  DX:SI  Pt meets inpatient criteria per Joaquim Nam  Attending Physician will be Ancil Linsey  Report can be called to: - [phone]  Pt can arrive after: TRANSFER READY NOW  Care Team notified: Night [facility] AC Kim Brooks,RN.Avel Sensor, Darlin Priestly [place],LCSWA, Joaquim Nam, Veeraindar Goli,MD,Archana Kumar,MD, Bobby Karoline Caldwell, Eugene Garnet, LCSWA 08/09/2023 @ 9:11 PM

## 2023-08-10 ENCOUNTER — Encounter (HOSPITAL_COMMUNITY): Payer: Self-pay | Admitting: Psychiatry

## 2023-08-10 ENCOUNTER — Encounter (HOSPITAL_COMMUNITY): Payer: Self-pay

## 2023-08-10 DIAGNOSIS — F19959 Other psychoactive substance use, unspecified with psychoactive substance-induced psychotic disorder, unspecified: Secondary | ICD-10-CM | POA: Diagnosis not present

## 2023-08-10 DIAGNOSIS — F1020 Alcohol dependence, uncomplicated: Secondary | ICD-10-CM | POA: Diagnosis not present

## 2023-08-10 DIAGNOSIS — F32A Depression, unspecified: Secondary | ICD-10-CM | POA: Diagnosis not present

## 2023-08-10 DIAGNOSIS — F17210 Nicotine dependence, cigarettes, uncomplicated: Secondary | ICD-10-CM | POA: Diagnosis not present

## 2023-08-10 NOTE — ED Notes (Signed)
 Patient was provided dinner

## 2023-08-10 NOTE — ED Notes (Signed)
MHT took break 0500-0530

## 2023-08-10 NOTE — Group Note (Signed)
Group Topic: Emotional Regulation  Group Date: 08/10/2023 Start Time: 1700 End Time: 1730 Facilitators: Prentice Docker, RN  Department: [facility]  Number of Participants: 5  Group Focus: coping skills Treatment Modality:  Solution-Focused Therapy Interventions utilized were problem solving Purpose: enhance coping skills  Name: William Allison Date of Birth: 1991/01/07  MR: [id number]    Level of Participation: active Quality of Participation: attentive and cooperative Interactions with others: gave feedback Mood/Affect: appropriate Triggers (if applicable): "being in the wrong crowd" Cognition: goal directed Progress: Gaining insight Response: "I need to learn to stay away from people not good for me and just walk away" Plan: patient will be encouraged to stay focused and practice coping skills once discharged  Patients Problems:  Patient Active Problem List   Diagnosis Date Noted   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

## 2023-08-10 NOTE — ED Notes (Signed)
Pt is in his room talking to himself for the past 30 mins.

## 2023-08-10 NOTE — ED Notes (Signed)
Patient A&Ox4. Denies intent to harm self/others when asked. Denies A/VH. Patient denies any physical complaints when asked. No acute distress noted. Support and encouragement provided. Routine safety checks conducted according to facility protocol. Encouraged patient to notify staff if thoughts of harm toward self or others arise. Patient verbalize understanding and agreement. Will continue to monitor for safety.    

## 2023-08-10 NOTE — Tx Team (Signed)
LCSW and MD met with patient to assess current mood, affect, physical state, and inquire about needs/goals while here in [facility] and after discharge.    Patient is a 32 year old Male who is unknown to the [facility] who presents seeking treatment for detox due to substance abuse and symptoms of drug induced psychosis.    Per patient he has been homeless for the last couple of months after being laid off of his job and has been getting himself involved in "things that are not good for my health". Pt shared that he lived with his parents up until 6 months ago, and then gained employment but was laid off and has been homeless for 2 months now. Pt shared that the last four days have been the scariest for him as he has been on a 4 day "meth binge" mixed with alcohol and cocaine use. Pt's toxicology report came back positive for amphetamines, cocaine, and THC Delta 9. Pt reports a history of alcoholism, use of 4 40 ounce containers of alcohol daily for years. Pt reports a family history of alcoholism (biological father). Pt shared that he began using substances at the age of 70 and has not had a period of sobriety longer than a month since that time. Last period of 1 month sobriety pt recalls was 5-6 years ago.   Pt presented to the ED on 11/5 with paranoia and acute psychosis after meth use. Patient shared he was very paranoid at that time and believed that he was being chased by 17 people who were trying to kill him. Patient report unable to sleep for 4-5 days. Pt adamant that this was a real situation, despite the multiple incongruences in his story. Patient reports having limited social supports, however shared that he is in touch with his parents and his siblings. Patient has one 4 year old son who lives with his mother, and he sees a couple of times a month.    Upon assessment patient was calm and cooperative, alert and oriented x4. Pt currently denying any SI/HI/AH/VH/SIB. Patient reports his current goal is to seek  residential placement/sober living/outpatient resources for substance use. Patient denies any prior history of inpatient substance abuse treatment. Pt receives medication for ADHD from Mady Gemma, [place] at [facility]. Pt shared that his goal of treatment is "to become sober and get some help", pt expressed interest in attending a 28 day residential treatment program for his substance abuse.    Patient aware that LCSW will send referrals out for review and will follow up to provide updates as received. Patient expressed understanding and appreciation of LCSW assistance. No other needs were reported at this time by patient.    Referrals have been sent to [facility] and [facility] for review. LCSW will continue to follow and provide support to patient while on [facility] unit.      Colvin Caroli, LCSW Clinical Social Worker [facility] Ph: [phone]

## 2023-08-10 NOTE — ED Provider Notes (Signed)
Facility Based Crisis Admission H&P  Date: 08/10/23 Patient Name: William Allison MRN: [id number] Chief Complaint: " I do need some help to go off drugs and alcohol" patient was also acutely psychotic when he first came ED.  Diagnoses:  Final diagnoses:  None    HPI: The patient is a 32 year old Caucasian male who was admitted to [facility] after being evaluated in the ED for acute psychosis.  Apparently he was seen in the emergency department via EMS with concerns of acute agitation and psychosis.  Patient was very paranoid stating that people were chasing him and trying to kill him.  He has been using methamphetamines for the last 4 days.  Apparently he was placed on IVC which was allowed to expire because he became fairly cooperative in the ED and was seeking voluntary admission.  In the ED he also complained of being very suspicious and states that he was struggling lately and has not slept for the last 4 to 5 days.  He has not been eating or sleeping well and believes that people are trying to she is after him and the people were trying to" jump" him. He also acknowledges ongoing depression with feelings of helplessness and hopelessness and states that he has been drinking frequently and using marijuana and using methamphetamines.  He also has been using benzodiazepines and stimulants and has been abusing and buying Xanax and Adderall off the streets. He denies any prior treatment for substance use disorder.  He agreed for voluntary admission to the [facility] for crisis stabilization. Patient reports that he was living with his parents until he found a job and had an apartment of his own.  Unfortunately he continued to drink and use drugs and recently lost his job.  He has been homeless for the last 2 months.  PHQ 2-9:  0 Flowsheet Row ED from 08/09/2023 in [facility] ED from 08/08/2023 in [facility] Emergency Department at [facility]  C-SSRS RISK CATEGORY No Risk No  Risk         Total Time spent with patient: 30 minutes  Musculoskeletal  Strength & Muscle Tone: within normal limits Gait & Station: normal Patient leans: N/A  Psychiatric Specialty Exam  Presentation General Appearance:  Appropriate for Environment; Disheveled  Eye Contact: Fair  Speech: Normal Rate  Speech Volume: Increased  Handedness: Right   Mood and Affect  Mood: Anxious  Affect: Appropriate   Thought Process  Thought Processes: Linear  Descriptions of Associations:Intact  Orientation:Full (Time, Place and Person)  Thought Content:Paranoid Ideation; Delusions  Diagnosis of Schizophrenia or Schizoaffective disorder in past: No  Duration of Psychotic Symptoms: Less than six months  Hallucinations:Hallucinations: None  Ideas of Reference:Paranoia  Suicidal Thoughts:Suicidal Thoughts: No  Homicidal Thoughts:Homicidal Thoughts: No   Sensorium  Memory: Immediate Fair; Recent Fair; Remote Fair  Judgment: Fair  Insight: Fair   Art therapist  Concentration: Fair  Attention Span: Fair  Recall: [employer] of Knowledge: Fair  Language: Fair   Psychomotor Activity  Psychomotor Activity: Psychomotor Activity: Normal   Assets  Assets: Communication Skills; Desire for Improvement   Sleep  Sleep: Sleep: Fair   No data recorded  Physical Exam ROS  Blood pressure 129/80, pulse 67, temperature 98.7 F (37.1 C), temperature source Tympanic, resp. rate 18, SpO2 100%. There is no height or weight on file to calculate BMI.  Past Psychiatric History: The patient denies any prior hospitalizations for anxiety or depression.  He does have  a long history of substance abuse dating back to age 66 but has never been formally diagnosed with any psychiatric illness.  He denies inpatient admissions but claims that he has been taking Adderall and Klonopin prescribed by his provider in addition to abusing methamphetamines.  Is  the patient at risk to self? No  Has the patient been a risk to self in the past 6 months? No .    Has the patient been a risk to self within the distant past? No   Is the patient a risk to others? No   Has the patient been a risk to others in the past 6 months? No   Has the patient been a risk to others within the distant past? No   Past Medical History: Denies any. Family History: Unknown at this time. Social History: The patient is single but does have a 69-year-old son.  Apparently son lives with his mother.  The patient is currently homeless and has lost his job.  He is willing to go to a rehab facility.  Last Labs:  Admission on 08/08/2023, Discharged on 08/09/2023  Component Date Value Ref Range Status   Sodium 08/08/2023 134 (L)  135 - 145 mmol/L Final   Potassium 08/08/2023 3.4 (L)  3.5 - 5.1 mmol/L Final   Chloride 08/08/2023 98  98 - 111 mmol/L Final   CO2 08/08/2023 21 (L)  22 - 32 mmol/L Final   Glucose, Bld 08/08/2023 108 (H)  70 - 99 mg/dL Final   Glucose reference range applies only to samples taken after fasting for at least 8 hours.   BUN 08/08/2023 19  6 - 20 mg/dL Final   Creatinine, Ser 08/08/2023 1.06  0.61 - 1.24 mg/dL Final   Calcium 57/84/6962 9.5  8.9 - 10.3 mg/dL Final   Total Protein 95/28/4132 8.4 (H)  6.5 - 8.1 g/dL Final   Albumin 44/10/270 4.5  3.5 - 5.0 g/dL Final   AST 53/66/4403 27  15 - 41 U/L Final   ALT 08/08/2023 21  0 - 44 U/L Final   Alkaline Phosphatase 08/08/2023 80  38 - 126 U/L Final   Total Bilirubin 08/08/2023 1.6 (H)  <1.2 mg/dL Final   GFR, Estimated 08/08/2023 >60  >60 mL/min Final   Comment: (NOTE) Calculated using the CKD-EPI Creatinine Equation (2021)    Anion gap 08/08/2023 15  5 - 15 Final   Performed at [facility], [address] [place], [place] [address]   Alcohol, Ethyl (B) 08/08/2023 <10  <10 mg/dL Final   Comment: (NOTE) Lowest detectable limit for serum alcohol is 10 mg/dL.  For medical purposes  only. Performed at [facility], [address], [place], [place] [address]    WBC 08/08/2023 13.8 (H)  4.0 - 10.5 K/uL Final   RBC 08/08/2023 4.80  4.22 - 5.81 MIL/uL Final   Hemoglobin 08/08/2023 15.9  13.0 - 17.0 g/dL Final   HCT 75/64/3329 45.7  39.0 - 52.0 % Final   MCV 08/08/2023 95.2  80.0 - 100.0 fL Final   MCH 08/08/2023 33.1  26.0 - 34.0 pg Final   MCHC 08/08/2023 34.8  30.0 - 36.0 g/dL Final   RDW 51/88/4166 11.7  11.5 - 15.5 % Final   Platelets 08/08/2023 299  150 - 400 K/uL Final   nRBC 08/08/2023 0.0  0.0 - 0.2 % Final   Performed at [facility], [address] [place], [place] [address]   Opiates 08/09/2023  NONE DETECTED  NONE DETECTED Final   Cocaine 08/09/2023 POSITIVE (A)  NONE DETECTED Final   Benzodiazepines 08/09/2023 NONE DETECTED  NONE DETECTED Final   Amphetamines 08/09/2023 POSITIVE (A)  NONE DETECTED Final   Tetrahydrocannabinol 08/09/2023 POSITIVE (A)  NONE DETECTED Final   Barbiturates 08/09/2023 NONE DETECTED  NONE DETECTED Final   Comment: (NOTE) DRUG SCREEN FOR MEDICAL PURPOSES ONLY.  IF CONFIRMATION IS NEEDED FOR ANY PURPOSE, NOTIFY LAB WITHIN 5 DAYS.  LOWEST DETECTABLE LIMITS FOR URINE DRUG SCREEN Drug Class                     Cutoff (ng/mL) Amphetamine and metabolites    1000 Barbiturate and metabolites    200 Benzodiazepine                 200 Opiates and metabolites        300 Cocaine and metabolites        300 THC                            50 Performed at [facility], [address], [place], [place] [address]    Acetaminophen (Tylenol), Serum 08/08/2023 <10 (L)  10 - 30 ug/mL Final   Comment: (NOTE) Therapeutic concentrations vary significantly. A range of 10-30 ug/mL  may be an effective concentration for many patients. However, some  are best treated at concentrations outside of this range. Acetaminophen concentrations >150 ug/mL at 4 hours after ingestion  and >50 ug/mL  at 12 hours after ingestion are often associated with  toxic reactions.  Performed at [facility] [address], [place], [place] [address]    Salicylate Lvl 08/08/2023 <7.0 (L)  7.0 - 30.0 mg/dL Final   Performed at [facility], [address] [place], [place] [address]    Allergies: Patient has no known allergies.  Medications:  Facility Ordered Medications  Medication   OLANZapine (ZYPREXA) tablet 5 mg   acetaminophen (TYLENOL) tablet 650 mg   alum & mag hydroxide-simeth (MAALOX/MYLANTA) 200-200-20 MG/5ML suspension 30 mL   magnesium hydroxide (MILK OF MAGNESIA) suspension 30 mL   traZODone (DESYREL) tablet 50 mg   PTA Medications  Medication Sig   amphetamine-dextroamphetamine (ADDERALL) 30 MG tablet Take 1 tablet by mouth 2 (two) times daily.   clonazePAM (KLONOPIN) 1 MG tablet Take half to 1 tablet (1 mg total) by mouth daily. (Patient taking differently: Take 1 tablet by mouth daily.)    Long Term Goals: Improvement in symptoms so as ready for discharge  Short Term Goals: Patient will verbalize feelings in meetings with treatment team members., Patient will attend at least of 50% of the groups daily., Pt will complete the PHQ9 on admission, day 3 and discharge., Patient will participate in completing the [place] Suicide Severity Rating Scale, Patient will score a low risk of violence for 24 hours prior to discharge, and Patient will take medications as prescribed daily.  Medical Decision Making  The patient is admitted to [facility] on a voluntary status for stabilization of his crisis.  He was acutely paranoid when he first came in but is currently not paranoid and denies any auditory or visual hallucinations or delusions.  He still believes that somebody was following him and was trying to hurt him.  He has an extensive history of alcohol and substance use and his longest period of sobriety was less than a month.  He denies depression and  he denies suicidal or homicidal ideations.  The plan is to admit him for stabilization and monitor his withdrawal symptoms.  He was also placed on Zyprexa because of his acute paranoia.  Upon discharge will refer him to a rehab facility.    Recommendations  Based on my evaluation the patient does not appear to have an emergency medical condition.  Rex Kras, MD 08/10/23  10:58 AM

## 2023-08-10 NOTE — Group Note (Signed)
Group Topic: Healthy Self Image and Positive Change  Group Date: 08/10/2023 Start Time: 1115 End Time: 1145 Facilitators: Cassandria Anger  Department: [facility]  Number of Participants: 2  Group Focus: self-awareness Treatment Modality:  Psychoeducation Interventions utilized were patient education Purpose: increase insight  Name: William Allison Date of Birth: 1990-11-23  MR: [id number]    Level of Participation: Patient did not attend MHT group. Patient did attend Nutritional Group.  Quality of Participation: cooperative and offered feedback Interactions with others: gave feedback Mood/Affect: appropriate and positive Triggers (if applicable): N/A Cognition: coherent/clear and concrete Progress: Minimal Response: Patient offered feedback with Nutrition group. Helped describe what categories food belongs in.  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

## 2023-08-10 NOTE — ED Notes (Signed)
Patient admitted from [facility] involuntary to [facility] for Meth, cocaine and Marijuana detox. Per report, pt was delusional and having behavior issues which made him unsafe to himself and others so patient was ivced. On arrival, patient A/Ox4. MAE.  Stayed, calm and pleasant during the admission process. Oriented to the unit and unit rules. Skin assessment done and documented. Patient has rashes all over his back and old skin scratches to his bil lower extremities. Denies, SI/HI/AVH. Snacks was provided and patient went to bed. Will continue to monitor for safety.

## 2023-08-10 NOTE — ED Notes (Signed)
Pt sleeping in no acute distress. RR even and unlabored. Environment secured. Will continue to monitor for safety. 

## 2023-08-10 NOTE — ED Notes (Signed)
Patient in the bedroom calm and sleeping. NAD. Respirations are even and unlabored. Will continue to monitor for safety.

## 2023-08-10 NOTE — ED Notes (Signed)
Patient was provided breakfast

## 2023-08-10 NOTE — ED Notes (Signed)
Pt laying on bed resting with eyes closed. No acute distress. RR even and unlabored. Environment secured. Will continue to monitor for safety.

## 2023-08-10 NOTE — ED Notes (Signed)
Pt is in the dayroom watching TV with peers. Pt denies SI/HI/AVH. Pt has no further complain.No acute distress noted. Will continue to monitor for safety and provide support.

## 2023-08-10 NOTE — BH IP Treatment Plan (Signed)
Interdisciplinary Treatment and Diagnostic Plan Update  08/10/2023 Time of Session: 10:00 AM William Allison MRN: [id number]  Diagnosis:  Final diagnoses:  None     Current Medications:  Current Facility-Administered Medications  Medication Dose Route Frequency Provider Last Rate Last Admin   acetaminophen (TYLENOL) tablet 650 mg  650 mg Oral Q6H PRN Weber, Kyra A, NP       alum & mag hydroxide-simeth (MAALOX/MYLANTA) 200-200-20 MG/5ML suspension 30 mL  30 mL Oral Q4H PRN Weber, Kyra A, NP       magnesium hydroxide (MILK OF MAGNESIA) suspension 30 mL  30 mL Oral Daily PRN Weber, Kyra A, NP       OLANZapine (ZYPREXA) tablet 5 mg  5 mg Oral Daily Weber, Kyra A, NP   5 mg at 08/10/23 0947   traZODone (DESYREL) tablet 50 mg  50 mg Oral QHS PRN Sindy Guadeloupe, NP   50 mg at 08/09/23 2343   Current Outpatient Medications  Medication Sig Dispense Refill   amphetamine-dextroamphetamine (ADDERALL) 30 MG tablet Take 1 tablet by mouth 2 (two) times daily. 60 tablet 0   clonazePAM (KLONOPIN) 1 MG tablet Take half to 1 tablet (1 mg total) by mouth daily. (Patient taking differently: Take 1 tablet by mouth daily.) 30 tablet 5   PTA Medications: Prior to Admission medications   Medication Sig Start Date End Date Taking? Authorizing Provider  amphetamine-dextroamphetamine (ADDERALL) 30 MG tablet Take 1 tablet by mouth 2 (two) times daily. 07/20/23     clonazePAM (KLONOPIN) 1 MG tablet Take half to 1 tablet (1 mg total) by mouth daily. Patient taking differently: Take 1 tablet by mouth daily. 07/20/23       Patient Stressors: Substance abuse    Patient Strengths: Active sense of humor  Capable of independent living  Communication skills  Work skills   Treatment Modalities: Medication Management, Group therapy, Case management,  1 to 1 session with clinician, Psychoeducation, Recreational therapy.   Physician Treatment Plan for Primary and Secondary Diagnosis:  Final diagnoses:  None   Long  Term Goal(s): Improvement in symptoms so as ready for discharge  Short Term Goals: Patient will verbalize feelings in meetings with treatment team members. Patient will attend at least of 50% of the groups daily. Pt will complete the PHQ9 on admission, day 3 and discharge. Patient will participate in completing the [place] Suicide Severity Rating Scale Patient will score a low risk of violence for 24 hours prior to discharge Patient will take medications as prescribed daily.  Medication Management: Evaluate patient's response, side effects, and tolerance of medication regimen.  Therapeutic Interventions: 1 to 1 sessions, Unit Group sessions and Medication administration.  Evaluation of Outcomes: Progressing  LCSW Treatment Plan for Primary Diagnosis:  Final diagnoses:  None    Long Term Goal(s): Safe transition to appropriate next level of care at discharge.  Short Term Goals: Facilitate acceptance of mental health diagnosis and concerns through verbal commitment to aftercare plan and appointments at discharge., Patient will identify one social support prior to discharge to aid in patient's recovery., Patient will attend AA/NA groups as scheduled., Identify minimum of 2 triggers associated with mental health/substance abuse issues with treatment team members., and Increase skills for wellness and recovery by attending 50% of scheduled groups.  Therapeutic Interventions: Assess for all discharge needs, 1 to 1 time with Social worker, Explore available resources and support systems, Assess for adequacy in community support network, Educate family and significant other(s) on suicide prevention, Complete  Psychosocial Assessment, Interpersonal group therapy.  Evaluation of Outcomes: Progressing   Progress in Treatment: Attending groups: Yes. Participating in groups: Yes. Taking medication as prescribed: Yes. Toleration medication: Yes. Family/Significant other contact made: No, will  contact:  No contact requested Patient understands diagnosis: Yes. Discussing patient identified problems/goals with staff: Yes. Medical problems stabilized or resolved: Yes. Denies suicidal/homicidal ideation: Yes. Issues/concerns per patient self-inventory: Yes. Other:   New problem(s) identified: No, Describe:  No new problems identified other than upon assessment  New Short Term/Long Term Goal(s): Safe transition to appropriate next level of care at discharge, Engage patient in therapeutic group addressing interpersonal concerns. Engage patient in aftercare planning with referrals and resources, Increase ability to appropriately verbalize feelings, Facilitate acceptance of mental health diagnosis and concerns and Identify triggers associated with mental health/substance abuse issues.    Patient Goals: Residential Treatment  Discharge Plan or Barriers: Residential Treatment   Reason for Continuation of Hospitalization: Anxiety Hallucinations Medication stabilization Withdrawal symptoms  Estimated Length of Stay: 3-5 days  Last 3 [place] Suicide Severity Risk Score: Flowsheet Row ED from 08/09/2023 in [facility] ED from 08/08/2023 in [facility] Emergency Department at [facility]  C-SSRS RISK CATEGORY No Risk No Risk       Last [facility] 2/9 Scores:    08/10/2023   10:58 AM  Depression screen PHQ 2/9  Decreased Interest 0  Down, Depressed, Hopeless 0  PHQ - 2 Score 0    Scribe for Treatment Team: Loleta Dicker, LCSW 08/10/2023 11:24 AM

## 2023-08-10 NOTE — Group Note (Signed)
Group Topic: Communication  Group Date: 08/10/2023 Start Time: 2000 End Time: 2200 Facilitators: Rae Lips B  Department: [facility]  Number of Participants: 4  Group Focus: abuse issues, acceptance, activities of daily living skills, affirmation, chemical dependency issues, clarity of thought, communication, community group, coping skills, daily focus, depression, diagnosis education, discharge education, family, and feeling awareness/expression Treatment Modality:  Exposure Therapy, Individual Therapy, Leisure Development, and Spiritual Interventions utilized were leisure development, problem solving, reminiscence, story telling, and support Purpose: enhance coping skills, express feelings, express irrational fears, increase insight, relapse prevention strategies, and trigger / craving management  Name: William Allison Date of Birth: 10-13-1990  MR: [id number]    Level of Participation: active Quality of Participation: attentive and cooperative Interactions with others: gave feedback Mood/Affect: appropriate Triggers (if applicable): NA Cognition: coherent/clear and goal directed Progress: Gaining insight Response: NA Plan: patient will be encouraged to keep going to groups.   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

## 2023-08-10 NOTE — ED Notes (Signed)
Patient was provided lunch.

## 2023-08-11 DIAGNOSIS — F32A Depression, unspecified: Secondary | ICD-10-CM | POA: Diagnosis not present

## 2023-08-11 DIAGNOSIS — F19959 Other psychoactive substance use, unspecified with psychoactive substance-induced psychotic disorder, unspecified: Secondary | ICD-10-CM | POA: Diagnosis not present

## 2023-08-11 DIAGNOSIS — F1020 Alcohol dependence, uncomplicated: Secondary | ICD-10-CM | POA: Diagnosis not present

## 2023-08-11 DIAGNOSIS — F17210 Nicotine dependence, cigarettes, uncomplicated: Secondary | ICD-10-CM | POA: Diagnosis not present

## 2023-08-11 MED ORDER — BENZTROPINE MESYLATE 1 MG PO TABS
1.0000 mg | ORAL_TABLET | Freq: Two times a day (BID) | ORAL | Status: DC | PRN
  Administered 2023-08-11: 1 mg via ORAL
  Filled 2023-08-11: qty 1

## 2023-08-11 MED ORDER — HYDROXYZINE HCL 25 MG PO TABS
50.0000 mg | ORAL_TABLET | Freq: Four times a day (QID) | ORAL | Status: DC | PRN
  Administered 2023-08-11: 50 mg via ORAL
  Filled 2023-08-11: qty 2

## 2023-08-11 MED ORDER — NICOTINE POLACRILEX 2 MG MT GUM
2.0000 mg | CHEWING_GUM | OROMUCOSAL | Status: DC
  Administered 2023-08-11 – 2023-08-12 (×4): 2 mg via ORAL
  Filled 2023-08-11 (×4): qty 1

## 2023-08-11 NOTE — ED Notes (Signed)
Pt is witnessed talking to himself in his sleep. Writer asked pt if he is aware he is talking in his sleep? Pt said yes. When asked if he is hearing or seeing people in his room?Pt said, no, he is neither seeing anyone nor hearing anyone and that he is dreaming. Will continue to monitor and provide support.

## 2023-08-11 NOTE — ED Notes (Signed)
Pt sitting in dayroom interacting with peers. No acute distress noted. No concerns voiced. Informed pt to notify staff with any needs or assistance. Pt verbalized understanding or agreement. Will continue to monitor for safety. 

## 2023-08-11 NOTE — ED Provider Notes (Signed)
Facility Based Crisis progress note  Date: 08/11/23 Patient Name: William Allison MRN: [id number] Chief Complaint: " I do need some help to go off drugs and alcohol" patient was also acutely psychotic when he first came ED.  Diagnoses:  Final diagnoses:  None   Subjective: The patient was admitted yesterday and continues to be cooperative and compliant with treatment.  Staff reports no behavioral problems.  He adamantly denies any paranoia or delusions and thinks that what happened to him prior to going to the ED when he thought people were following him was real.  While in the hospital he is eating and sleeping well and denies any psychosis.  Objective: When seen today the patient was alert oriented and cooperative.  He maintained good eye contact.  His speech is coherent without any obvious looseness of association,flight of ideas or tangentiality.  However he seems to be in denial of his substance use disorder.  He reports that all he needs is a couple of days of detox and then he will be going back on the street and start making money by working.  He does not feel that he needs long-term rehab.  He is homeless but states that he will stay with his girlfriend or with his mother.  He states" I am manage".  He does endorse the need to be detoxed and continues to deny any SI/HI/AVH.  PHQ 2-9:  0 Flowsheet Row ED from 08/09/2023 in [facility] ED from 08/08/2023 in [facility] Emergency Department at [facility]  C-SSRS RISK CATEGORY No Risk No Risk         Total Time spent with patient: 30 minutes  Musculoskeletal  Strength & Muscle Tone: within normal limits Gait & Station: normal Patient leans: N/A  Psychiatric Specialty Exam  Presentation General Appearance:  Disheveled; Casual  Eye Contact: Fair  Speech: Normal Rate  Speech Volume: Normal  Handedness: Right   Mood and Affect  Mood: Anxious  Affect: Appropriate   Thought  Process  Thought Processes: Goal Directed  Descriptions of Associations:Intact  Orientation:Full (Time, Place and Person)  Thought Content:Rumination  Diagnosis of Schizophrenia or Schizoaffective disorder in past: No  Duration of Psychotic Symptoms: N/A  Hallucinations:Hallucinations: None  Ideas of Reference:None  Suicidal Thoughts:Suicidal Thoughts: No  Homicidal Thoughts:Homicidal Thoughts: No   Sensorium  Memory: Immediate Fair; Recent Fair; Remote Fair  Judgment: Poor  Insight: Fair   Chartered certified accountant: Fair  Attention Span: Fair  Recall: [employer] of Knowledge: Fair  Language: Fair   Psychomotor Activity  Psychomotor Activity: Psychomotor Activity: Normal   Assets  Assets: Communication Skills; Desire for Improvement   Sleep  Sleep: Sleep: Fair   No data recorded  Physical Exam Constitutional:      Appearance: Normal appearance.  Neurological:     General: No focal deficit present.     Mental Status: He is alert and oriented to person, place, and time. Mental status is at baseline.    Review of Systems  Psychiatric/Behavioral: Negative.    All other systems reviewed and are negative.   Blood pressure 123/75, pulse (!) 57, temperature 97.9 F (36.6 C), temperature source Oral, resp. rate 18, SpO2 100%. There is no height or weight on file to calculate BMI.  Past Psychiatric History: The patient denies any prior hospitalizations for anxiety or depression.  He does have a long history of substance abuse dating back to age 32 but has never been formally diagnosed  with any psychiatric illness.  He denies inpatient admissions but claims that he has been taking Adderall and Klonopin prescribed by his provider in addition to abusing methamphetamines.  Is the patient at risk to self? No  Has the patient been a risk to self in the past 6 months? No .    Has the patient been a risk to self within the distant past? No    Is the patient a risk to others? No   Has the patient been a risk to others in the past 6 months? No   Has the patient been a risk to others within the distant past? No   Past Medical History: Denies any. Family History: Unknown at this time. Social History: The patient is single but does have a 21-year-old son.  Apparently son lives with his mother.  The patient is currently homeless and has lost his job.  He is willing to go to a rehab facility.  Last Labs:  Admission on 08/08/2023, Discharged on 08/09/2023  Component Date Value Ref Range Status   Sodium 08/08/2023 134 (L)  135 - 145 mmol/L Final   Potassium 08/08/2023 3.4 (L)  3.5 - 5.1 mmol/L Final   Chloride 08/08/2023 98  98 - 111 mmol/L Final   CO2 08/08/2023 21 (L)  22 - 32 mmol/L Final   Glucose, Bld 08/08/2023 108 (H)  70 - 99 mg/dL Final   Glucose reference range applies only to samples taken after fasting for at least 8 hours.   BUN 08/08/2023 19  6 - 20 mg/dL Final   Creatinine, Ser 08/08/2023 1.06  0.61 - 1.24 mg/dL Final   Calcium 16/07/9603 9.5  8.9 - 10.3 mg/dL Final   Total Protein 54/06/8118 8.4 (H)  6.5 - 8.1 g/dL Final   Albumin 14/78/2956 4.5  3.5 - 5.0 g/dL Final   AST 21/30/8657 27  15 - 41 U/L Final   ALT 08/08/2023 21  0 - 44 U/L Final   Alkaline Phosphatase 08/08/2023 80  38 - 126 U/L Final   Total Bilirubin 08/08/2023 1.6 (H)  <1.2 mg/dL Final   GFR, Estimated 08/08/2023 >60  >60 mL/min Final   Comment: (NOTE) Calculated using the CKD-EPI Creatinine Equation (2021)    Anion gap 08/08/2023 15  5 - 15 Final   Performed at [facility], [address] [place], [place] [address]   Alcohol, Ethyl (B) 08/08/2023 <10  <10 mg/dL Final   Comment: (NOTE) Lowest detectable limit for serum alcohol is 10 mg/dL.  For medical purposes only. Performed at [facility], [address] [place], [place] [address]    WBC 08/08/2023 13.8 (H)  4.0 - 10.5 K/uL Final   RBC  08/08/2023 4.80  4.22 - 5.81 MIL/uL Final   Hemoglobin 08/08/2023 15.9  13.0 - 17.0 g/dL Final   HCT 41/32/4401 45.7  39.0 - 52.0 % Final   MCV 08/08/2023 95.2  80.0 - 100.0 fL Final   MCH 08/08/2023 33.1  26.0 - 34.0 pg Final   MCHC 08/08/2023 34.8  30.0 - 36.0 g/dL Final   RDW 02/72/5366 11.7  11.5 - 15.5 % Final   Platelets 08/08/2023 299  150 - 400 K/uL Final   nRBC 08/08/2023 0.0  0.0 - 0.2 % Final   Performed at [facility], [address] [place], [place] [address]   Opiates 08/09/2023 NONE DETECTED  NONE DETECTED Final   Cocaine 08/09/2023 POSITIVE (A)  NONE DETECTED Final  Benzodiazepines 08/09/2023 NONE DETECTED  NONE DETECTED Final   Amphetamines 08/09/2023 POSITIVE (A)  NONE DETECTED Final   Tetrahydrocannabinol 08/09/2023 POSITIVE (A)  NONE DETECTED Final   Barbiturates 08/09/2023 NONE DETECTED  NONE DETECTED Final   Comment: (NOTE) DRUG SCREEN FOR MEDICAL PURPOSES ONLY.  IF CONFIRMATION IS NEEDED FOR ANY PURPOSE, NOTIFY LAB WITHIN 5 DAYS.  LOWEST DETECTABLE LIMITS FOR URINE DRUG SCREEN Drug Class                     Cutoff (ng/mL) Amphetamine and metabolites    1000 Barbiturate and metabolites    200 Benzodiazepine                 200 Opiates and metabolites        300 Cocaine and metabolites        300 THC                            50 Performed at [facility], [address], [place], [place] [address]    Acetaminophen (Tylenol), Serum 08/08/2023 <10 (L)  10 - 30 ug/mL Final   Comment: (NOTE) Therapeutic concentrations vary significantly. A range of 10-30 ug/mL  may be an effective concentration for many patients. However, some  are best treated at concentrations outside of this range. Acetaminophen concentrations >150 ug/mL at 4 hours after ingestion  and >50 ug/mL at 12 hours after ingestion are often associated with  toxic reactions.  Performed at [facility], [address], [place],  [place] [address]    Salicylate Lvl 08/08/2023 <7.0 (L)  7.0 - 30.0 mg/dL Final   Performed at [facility], [address] [place], [place] [address]    Allergies: Patient has no known allergies.  Medications:  Facility Ordered Medications  Medication   OLANZapine (ZYPREXA) tablet 5 mg   acetaminophen (TYLENOL) tablet 650 mg   alum & mag hydroxide-simeth (MAALOX/MYLANTA) 200-200-20 MG/5ML suspension 30 mL   magnesium hydroxide (MILK OF MAGNESIA) suspension 30 mL   traZODone (DESYREL) tablet 50 mg   PTA Medications  Medication Sig   amphetamine-dextroamphetamine (ADDERALL) 30 MG tablet Take 1 tablet by mouth 2 (two) times daily.   clonazePAM (KLONOPIN) 1 MG tablet Take half to 1 tablet (1 mg total) by mouth daily. (Patient taking differently: Take 1 tablet by mouth daily.)    Long Term Goals: Improvement in symptoms so as ready for discharge  Short Term Goals: Patient will verbalize feelings in meetings with treatment team members., Patient will attend at least of 50% of the groups daily., Pt will complete the PHQ9 on admission, day 3 and discharge., Patient will participate in completing the [place] Suicide Severity Rating Scale, Patient will score a low risk of violence for 24 hours prior to discharge, and Patient will take medications as prescribed daily.  Medical Decision Making  The patient continues to be at the [facility] for detoxification and stabilization of his paranoia.  He denies any SI/HI/AVH.  He is currently ambivalent about going to a long-term rehab facility but we will continue to discuss with him the possibility.  Recommendations  Continue to monitor symptoms of withdrawals if any.  Continue with treatment of psychosis as indicated.  Recommend long-term rehab.  Rex Kras, MD 08/11/23  8:03 AM

## 2023-08-11 NOTE — ED Notes (Signed)
Patient is sleeping. Respirations equal and unlabored, skin warm and dry. No change in assessment or acuity. Routine safety checks conducted according to facility protocol. Will continue to monitor for safety.   

## 2023-08-11 NOTE — ED Notes (Signed)
Pt is in his room having a whole convo to himself.

## 2023-08-11 NOTE — ED Notes (Signed)
 Pt is in the dayroom watching TV with peers. Pt denies SI/HI/AVH. Pt has no further complain.No acute distress noted. Will continue to monitor for safety and provide support.

## 2023-08-11 NOTE — Group Note (Signed)
Group Topic: Social Support  Group Date: 08/11/2023 Start Time: 1006 End Time: 1040 Facilitators: Bertram Denver  Department: [facility]  Number of Participants: 2  Group Focus: social skills Treatment Modality:  Skills Training Interventions utilized were exploration Purpose: express feelings  Name: William Allison Date of Birth: 1991/03/07  MR: [id number]    Level of Participation: active Quality of Participation: cooperative Interactions with others: gave feedback Mood/Affect: appropriate Triggers (if applicable): na Cognition: concrete Progress: Moderate Response: na Plan: follow-up needed  Patients Problems:  Patient Active Problem List   Diagnosis Date Noted   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

## 2023-08-11 NOTE — Clinical Social Work Note (Signed)
LCSW and MD met with the patient today. The patient expressed a desire to go home and stated that he does not believe he will benefit from the 28-day substance abuse program. He denied experiencing any withdrawal symptoms but was overheard during several phone conversations expressing agitation and using profanity, primarily related to family members and friends denying him access to their homes. The patient reported cravings and reiterated his wish to be discharged.    The patient remains in denial about the severity of his substance use disorder, which contrasts with his earlier request for help during previous discussions. His current stance is different from the willingness to engage in treatment that he expressed yesterday. The patient appears to be experiencing emotional distress, evidenced by agitation and cravings. His ongoing denial of the substance use disorder, despite previous engagement in treatment, presents a barrier to his recovery process. The patient continues to show signs of psychological distress related to both his substance use and interpersonal issues.    The MD noted that the patient is currently on involuntary commitment (IVC) due to substance use-induced psychosis and emphasized the importance of ensuring the patient is fully detoxified and free of psychosis before considering discharge.    LCSW and MD discussed the patient's request for discharge and explained the need for a more comprehensive treatment approach to address both his substance use and psychosis. The patient was informed that discharge is not recommended at this time due to his current psychological state and need for further detoxification.    Per the patient's request, LCSW will cease the search for residential placement at this time, as the patient is not currently willing to engage in long-term treatment.    - Discharge from the program will be reconsidered once the patient is fully detoxified and free of  psychosis.   - LCSW will sign off to MD for final disposition.   - No further social work needs identified at this time. LCSW will remain available for support throughout the patient's stay.     William Caroli, LCSW Clinical Social Worker [facility] Ph: [phone]

## 2023-08-11 NOTE — Group Note (Signed)
Group Topic: Relapse and Recovery  Group Date: 08/11/2023 Start Time: 2000 End Time: 2100 Facilitators: Rae Lips B  Department: [facility]  Number of Participants: 3  Group Focus: abuse issues, acceptance, activities of daily living skills, communication, coping skills, depression, self-esteem, and social skills Treatment Modality:  Exposure Therapy Interventions utilized were leisure development Purpose: express feelings and relapse prevention strategies  Name: William Allison Date of Birth: November 11, 1990  MR: [id number]    Level of Participation: active Quality of Participation: attentive and cooperative Interactions with others: gave feedback Mood/Affect: appropriate Triggers (if applicable): NA Cognition: coherent/clear Progress: Gaining insight Response: NA Plan: patient will be encouraged to keep going to groups.   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

## 2023-08-11 NOTE — Progress Notes (Signed)
SPIRITUALITY GROUP NOTE  Spirituality group facilitated by Wilkie Aye, MDiv, BCC.  Group Description: Group focused on topic of hope. Patients participated in facilitated discussion around topic, connecting with one another around experiences and definitions for hope. Group members engaged with visual explorer photos, reflecting on what hope looks like for them today. Group engaged in discussion around how their definitions of hope are present today in hospital.  Modalities: Psycho-social ed, Adlerian, Narrative, MI  Patient Progress: William Allison was present throughout group.  Active and appropriately oriented in group discussion.

## 2023-08-11 NOTE — ED Notes (Signed)
The nicotine gum wasn't showing in the pyxis to enable pulling.

## 2023-08-11 NOTE — ED Notes (Signed)
Patient A&Ox4. Denies intent to harm self/others when asked. Denies A/VH. Patient denies any physical complaints when asked. No acute distress noted. Pt asking if he could sign himself out. Writer informed pt that he was IVC'ed upon arrival to the facility and only a provider could resend the paperwork. Pt request to speak with provider. Provided notified via secure chat. Pt verbalized understanding. Pleasant demeanor. Support and encouragement provided. Routine safety checks conducted according to facility protocol. Encouraged patient to notify staff if thoughts of harm toward self or others arise. Patient verbalize understanding and agreement. Will continue to monitor for safety.

## 2023-08-11 NOTE — ED Notes (Signed)
Pt sleeping in no acute distress. RR even and unlabored. Environment secured. Will continue to monitor for safety. 

## 2023-08-12 DIAGNOSIS — F17210 Nicotine dependence, cigarettes, uncomplicated: Secondary | ICD-10-CM | POA: Diagnosis not present

## 2023-08-12 DIAGNOSIS — F1020 Alcohol dependence, uncomplicated: Secondary | ICD-10-CM | POA: Diagnosis not present

## 2023-08-12 DIAGNOSIS — F32A Depression, unspecified: Secondary | ICD-10-CM | POA: Diagnosis not present

## 2023-08-12 DIAGNOSIS — F19959 Other psychoactive substance use, unspecified with psychoactive substance-induced psychotic disorder, unspecified: Secondary | ICD-10-CM | POA: Diagnosis not present

## 2023-08-12 MED ORDER — HYDROXYZINE HCL 25 MG PO TABS: 25.0000 mg | ORAL_TABLET | Freq: Three times a day (TID) | ORAL | 0 refills | Status: DC | PRN

## 2023-08-12 MED ORDER — NICOTINE POLACRILEX 2 MG MT GUM: 2.0000 mg | CHEWING_GUM | OROMUCOSAL | 0 refills | Status: DC

## 2023-08-12 MED ORDER — TRAZODONE HCL 50 MG PO TABS: 50.0000 mg | ORAL_TABLET | Freq: Every evening | ORAL | 0 refills | Status: DC | PRN

## 2023-08-12 MED ORDER — OLANZAPINE 5 MG PO TABS: 5.0000 mg | ORAL_TABLET | Freq: Every day | ORAL | 0 refills | Status: DC

## 2023-08-12 NOTE — ED Provider Notes (Signed)
FBC/OBS ASAP Discharge Summary  Date and Time: 08/12/2023 11:34 AM  Name: William Allison  MRN:  [id number]   Discharge Diagnoses:  Final diagnoses:  Substance-induced psychotic disorder with delusions (HCC)  Alcohol use disorder, moderate, dependence (HCC)  Cannabis use disorder  Stimulant use disorder  Benzodiazepine abuse (HCC)    Subjective: Patient is requesting discharge at this time.  He denies SI/HI/AVH.  While he still believes that people in the ED were related to people that were coming after him, he did feel that this was exclusive to the ED and not while at the [facility].  He reports wanting to moderate his alcohol and cigarette use but has no plans to stop using clonazepam, Adderall, THC use, and cocaine. I've advised him to not use any of the substances and he understands the risks involved with his polysubstance use. He understands discharging is a "not good" idea and staying at [facility] and going to residential rehab is a "great" idea but he does not feel ready to quit his substances. He states he will reach out when he is ready to quit hist substances.   Stay Summary by Problem List William Allison is a 32 y.o.male with a history of stimulant use disorder-cocaine and amphetamine, cannabis use disorder, alcohol use disorder, nicotine use disorder, substance-induced psychosis, sedative use disorder who was admitted to [facility] for management of suspected substance induced psychosis. Hospital course is detailed below:  Patient initially presented to ED under involuntary commitment due to acute psychosis.  He was admitted to facility [facility] crisis with IVC continue due to his paranoia.  During the brief hospitalization here at the facility base crisis, patient was started on olanzapine to better address his paranoia.  Patient's paranoia began to subside and he was significantly less anxious as the methamphetamine wore off. Patient was precontemplative regarding his multiple substance uses.  Patient was  offered residential rehab but he declined it on day of discharge. No acute withdrawal symptoms were identified during this brief hospitalization.  Given the patient was no longer psychotic, olanzapine was discontinued.  He was provided trazodone to better aid with insomnia although insomnia was likely secondary to his methamphetamine use.  Advised recurrently to cease all substance use given risk for psychosis and detrimental effects on his health and while patient understood risks, he still was uninterested in discontinuing substance abuse. His IVC was rescinded as he no longer was psychotic and danger to self.    Given his abuse of cocaine, methamphetamine, benzodiazepine, amphetamines, and alcohol abuse, I do not believe patient is an appropriate candidate for stimulant treatment for his ADHD nor clonazepam for his anxiety.     Total Time spent with patient: 30 minutes  Past Psychiatric History: he patient denies any prior hospitalizations for anxiety or depression. He does have a long history of substance abuse dating back to age 32 but has never been formally diagnosed with any psychiatric illness. He denies inpatient admissions but claims that he has been taking Adderall and Klonopin prescribed by his provider in addition to abusing methamphetamines.    Past Medical History:  Past Medical History:  Diagnosis Date   ADD (attention deficit disorder)    Constipation 05/30/12   [facility], [facility]; [facility] q.i.d. Follow up in 4 weeks.   GERD (gastroesophageal reflux disease)     Past Surgical History:  Procedure Laterality Date   HAND SURGERY     for fracture   LAPAROSCOPIC APPENDECTOMY N/A 12/26/2016   Procedure:  APPENDECTOMY LAPAROSCOPIC;  Surgeon: Glenna Fellows, MD;  Location: [facility]S;  Service: General;  Laterality: N/A;   TYMPANOSTOMY TUBE PLACEMENT     Family History: History reviewed. No pertinent family history. Family Psychiatric History: unknown Social  History:  Social History   Substance and Sexual Activity  Alcohol Use Yes   Comment: every day     Social History   Substance and Sexual Activity  Drug Use Yes   Types: Marijuana, Cocaine   Comment: daily    Social History   Socioeconomic History   Marital status: Single    Spouse name: Not on file   Number of children: Not on file   Years of education: Not on file   Highest education level: Not on file  Occupational History   Not on file  Tobacco Use   Smoking status: Every Day    Current packs/day: 0.50    Types: Cigarettes   Smokeless tobacco: Never  Substance and Sexual Activity   Alcohol use: Yes    Comment: every day   Drug use: Yes    Types: Marijuana, Cocaine    Comment: daily   Sexual activity: Not on file  Other Topics Concern   Not on file  Social History Narrative   Not on file   Social Determinants of Health   Financial Resource Strain: Not on file  Food Insecurity: Food Insecurity Present (08/10/2023)   Hunger Vital Sign    Worried About Running Out of Food in the Last Year: Often true    Ran Out of Food in the Last Year: Often true  Transportation Needs: Unmet Transportation Needs (08/10/2023)   PRAPARE - Administrator, Civil Service (Medical): Yes    Lack of Transportation (Non-Medical): Yes  Physical Activity: Not on file  Stress: Not on file  Social Connections: Not on file   SDOH:  SDOH Screenings   Food Insecurity: Food Insecurity Present (08/10/2023)  Housing: High Risk (08/10/2023)  Transportation Needs: Unmet Transportation Needs (08/10/2023)  Utilities: At Risk (08/10/2023)  Depression (PHQ2-9): Low Risk  (08/12/2023)  Tobacco Use: High Risk (08/10/2023)    Tobacco Cessation:  A prescription for an FDA-approved tobacco cessation medication was offered at discharge and the patient refused  Current Medications:  Current Facility-Administered Medications  Medication Dose Route Frequency Provider Last Rate Last Admin    acetaminophen (TYLENOL) tablet 650 mg  650 mg Oral Q6H PRN Weber, Kyra A, NP       alum & mag hydroxide-simeth (MAALOX/MYLANTA) 200-200-20 MG/5ML suspension 30 mL  30 mL Oral Q4H PRN Weber, Kyra A, NP       benztropine (COGENTIN) tablet 1 mg  1 mg Oral BID PRN Rex Kras, MD   1 mg at 08/11/23 1630   hydrOXYzine (ATARAX) tablet 50 mg  50 mg Oral Q6H PRN Rex Kras, MD   50 mg at 08/11/23 1519   magnesium hydroxide (MILK OF MAGNESIA) suspension 30 mL  30 mL Oral Daily PRN Weber, Kyra A, NP       nicotine polacrilex (NICORETTE) gum 2 mg  2 mg Oral Q4H while awake Rex Kras, MD   2 mg at 08/12/23 0822   OLANZapine (ZYPREXA) tablet 5 mg  5 mg Oral Daily Weber, Kyra A, NP   5 mg at 08/12/23 2951   traZODone (DESYREL) tablet 50 mg  50 mg Oral QHS PRN Sindy Guadeloupe, NP   50 mg at 08/11/23 2107   Current Outpatient Medications  Medication Sig Dispense  Refill   nicotine polacrilex (NICORETTE) 2 MG gum Take 1 each (2 mg total) by mouth every 4 (four) hours while awake. 100 tablet 0   traZODone (DESYREL) 50 MG tablet Take 1 tablet (50 mg total) by mouth at bedtime as needed for sleep. 30 tablet 0    PTA Medications: (Not in a hospital admission)       08/12/2023   10:55 AM 08/10/2023   10:58 AM  Depression screen PHQ 2/9  Decreased Interest 0 0  Down, Depressed, Hopeless 0 0  PHQ - 2 Score 0 0    Flowsheet Row ED from 08/09/2023 in [facility] ED from 08/08/2023 in [facility] Emergency Department at [facility]  C-SSRS RISK CATEGORY No Risk No Risk       Musculoskeletal  Strength & Muscle Tone: within normal limits Gait & Station: normal Patient leans: N/A  Psychiatric Specialty Exam  Presentation  General Appearance:  Disheveled; Casual   Eye Contact: Fair   Speech: Normal Rate   Speech Volume: Normal   Handedness: Right    Mood and Affect  Mood: Anxious   Affect: Appropriate    Thought Process   Thought Processes: Goal Directed   Descriptions of Associations:Intact   Orientation:Full (Time, Place and Person)   Thought Content:Rumination      Hallucinations:Hallucinations: None   Ideas of Reference:None   Suicidal Thoughts:Suicidal Thoughts: No   Homicidal Thoughts:Homicidal Thoughts: No    Sensorium  Memory: Immediate Fair; Recent Fair; Remote Fair   Judgment: Poor   Insight: Fair    Chartered certified accountant: Fair   Attention Span: Fair   Recall: [employer] of Knowledge: Fair   Language: Fair    Psychomotor Activity  Psychomotor Activity: Psychomotor Activity: Normal    Assets  Assets: Communication Skills; Desire for Improvement    Sleep  Sleep: Sleep: Fair    No data recorded   Physical Exam  Physical Exam ROS Blood pressure 117/73, pulse 65, temperature 97.8 F (36.6 C), temperature source Oral, resp. rate 18, SpO2 100%. There is no height or weight on file to calculate BMI.  Demographic Factors:  Male and Caucasian  Loss Factors: NA  Historical Factors: Impulsivity  Risk Reduction Factors:   Responsible for children under 53 years of age, Employed, Positive social support, and Positive coping skills or problem solving skills  Continued Clinical Symptoms:  Severe Anxiety and/or Agitation Alcohol/Substance Abuse/Dependencies  Cognitive Features That Contribute To Risk:  None    Suicide Risk:  Mild:  There are no identifiable plans, no associated intent, mild dysphoria and related symptoms, good self-control (both objective and subjective assessment), few other risk factors, and identifiable protective factors, including available and accessible social support.  Plan Of Care/Follow-up recommendations:   Follow-up recommendations:   Activity:  as tolerated Diet:  heart healthy   Comments:  Prescriptions were given at discharge.  Patient is agreeable with the discharge plan.   Patient was given an opportunity to ask questions.  Patient appears to feel comfortable with discharge and denies any current suicidal or homicidal thoughts.    Patient is instructed prior to discharge to: Take all medications as prescribed by mental healthcare provider. Report any adverse effects and or reactions from the medicines to outpatient provider promptly. In the event of worsening symptoms, patient is instructed to call the crisis hotline, 911 and or go to the nearest ED for appropriate evaluation and treatment of symptoms. Patient is to  follow-up with primary care provider for other medical issues, concerns and or health care needs.   Park Pope, MD 08/12/2023, 11:34 AM

## 2023-08-12 NOTE — Discharge Summary (Addendum)
William Allison to be D/C'd Home per MD order. Discussed with the patient and all questions fully answered. An After Visit Summary was printed and given to the patient. Medication script was also given to patient. All belongings returned. Patient escorted out and D/C home via private auto.  [name]  08/12/2023 11:15 AM

## 2023-08-12 NOTE — Progress Notes (Signed)
Pt requested for discharge. Pt was informed that a provider will assess him this AM and a disposition will be made.

## 2023-08-12 NOTE — ED Notes (Signed)
Patient is sleeping. Respirations equal and unlabored, skin warm and dry. No change in assessment or acuity. Routine safety checks conducted according to facility protocol. Will continue to monitor for safety.   

## 2023-08-12 NOTE — ED Notes (Signed)
Pt came to the nursing station asking if he could call the ex girlfriend and talk to her, and that her ex girlfriend works upstairs. Writer told him, he wont be able to go  talk to the ex girlfriend now but the ex girlfriend can come down to talk to him if she works here, that means, she would have access to this place. Pt went back to his room and came back heading towards the exit, when asked where he was going, he said " I am going to meet my ex girlfriend, she is walking her dog on the sidewalk right behind my window. Staff went to his room with him, look through the window and there was nobody there but pt existed the ex girlfriend is still there.

## 2023-08-12 NOTE — Progress Notes (Signed)
Pt declined to fill out survey.

## 2023-08-12 NOTE — Progress Notes (Signed)
Pt is awake, alert and oriented X3. Pt did not voice any complaints of pain or discomfort. Pt appears to be anxious. No signs of acute distress noted. Administered scheduled meds per order. Pt denies current SI/HI/AVH, plan or intent. Staff will monitor for pt's safety.

## 2023-08-18 ENCOUNTER — Other Ambulatory Visit (HOSPITAL_BASED_OUTPATIENT_CLINIC_OR_DEPARTMENT_OTHER): Payer: Self-pay

## 2023-08-18 MED ORDER — AMPHETAMINE-DEXTROAMPHETAMINE 30 MG PO TABS
30.0000 mg | ORAL_TABLET | Freq: Two times a day (BID) | ORAL | 0 refills | Status: DC
  Filled 2023-08-18: qty 10, 5d supply, fill #0
  Filled 2023-08-18: qty 50, 25d supply, fill #0
  Filled 2023-08-18: qty 60, 30d supply, fill #0

## 2023-08-18 MED ORDER — CLONAZEPAM 1 MG PO TABS
0.5000 mg | ORAL_TABLET | Freq: Every day | ORAL | 0 refills | Status: DC
  Filled 2023-08-18: qty 30, 30d supply, fill #0

## 2023-08-19 ENCOUNTER — Other Ambulatory Visit (HOSPITAL_BASED_OUTPATIENT_CLINIC_OR_DEPARTMENT_OTHER): Payer: Self-pay

## 2023-09-14 ENCOUNTER — Other Ambulatory Visit (HOSPITAL_BASED_OUTPATIENT_CLINIC_OR_DEPARTMENT_OTHER): Payer: Self-pay

## 2023-09-14 MED ORDER — AMPHETAMINE-DEXTROAMPHETAMINE 30 MG PO TABS
30.0000 mg | ORAL_TABLET | Freq: Two times a day (BID) | ORAL | 0 refills | Status: DC
  Filled 2023-09-16 (×2): qty 60, 30d supply, fill #0

## 2023-09-14 MED ORDER — CLONAZEPAM 1 MG PO TABS
0.5000 mg | ORAL_TABLET | ORAL | 2 refills | Status: DC
  Filled 2023-09-16: qty 30, 30d supply, fill #0
  Filled 2024-01-13 – 2024-01-18 (×2): qty 30, 30d supply, fill #1
  Filled 2024-02-29: qty 30, 30d supply, fill #2

## 2023-09-16 ENCOUNTER — Other Ambulatory Visit (HOSPITAL_BASED_OUTPATIENT_CLINIC_OR_DEPARTMENT_OTHER): Payer: Self-pay

## 2023-10-10 ENCOUNTER — Other Ambulatory Visit (HOSPITAL_BASED_OUTPATIENT_CLINIC_OR_DEPARTMENT_OTHER): Payer: Self-pay

## 2023-10-10 MED ORDER — CLONAZEPAM 1 MG PO TABS
0.5000 mg | ORAL_TABLET | Freq: Every day | ORAL | 2 refills | Status: DC
  Filled 2023-10-10 – 2023-10-14 (×4): qty 30, 30d supply, fill #0
  Filled 2023-11-16: qty 30, 30d supply, fill #1
  Filled 2023-12-20: qty 30, 30d supply, fill #2

## 2023-10-11 ENCOUNTER — Other Ambulatory Visit (HOSPITAL_BASED_OUTPATIENT_CLINIC_OR_DEPARTMENT_OTHER): Payer: Self-pay

## 2023-10-11 MED ORDER — AMPHETAMINE-DEXTROAMPHETAMINE 30 MG PO TABS
30.0000 mg | ORAL_TABLET | Freq: Two times a day (BID) | ORAL | 0 refills | Status: DC
  Filled 2023-10-14 (×2): qty 60, 30d supply, fill #0

## 2023-10-12 ENCOUNTER — Other Ambulatory Visit (HOSPITAL_BASED_OUTPATIENT_CLINIC_OR_DEPARTMENT_OTHER): Payer: Self-pay

## 2023-10-14 ENCOUNTER — Other Ambulatory Visit (HOSPITAL_BASED_OUTPATIENT_CLINIC_OR_DEPARTMENT_OTHER): Payer: Self-pay

## 2023-10-17 ENCOUNTER — Other Ambulatory Visit (HOSPITAL_BASED_OUTPATIENT_CLINIC_OR_DEPARTMENT_OTHER): Payer: Self-pay

## 2023-11-14 ENCOUNTER — Other Ambulatory Visit (HOSPITAL_BASED_OUTPATIENT_CLINIC_OR_DEPARTMENT_OTHER): Payer: Self-pay

## 2023-11-14 MED ORDER — AMPHETAMINE-DEXTROAMPHETAMINE 30 MG PO TABS
30.0000 mg | ORAL_TABLET | Freq: Two times a day (BID) | ORAL | 0 refills | Status: DC
  Filled 2023-11-14 – 2023-11-15 (×2): qty 60, 30d supply, fill #0

## 2023-11-15 ENCOUNTER — Other Ambulatory Visit (HOSPITAL_BASED_OUTPATIENT_CLINIC_OR_DEPARTMENT_OTHER): Payer: Self-pay

## 2023-11-15 ENCOUNTER — Other Ambulatory Visit: Payer: Self-pay

## 2023-11-16 ENCOUNTER — Other Ambulatory Visit (HOSPITAL_BASED_OUTPATIENT_CLINIC_OR_DEPARTMENT_OTHER): Payer: Self-pay

## 2023-12-07 ENCOUNTER — Other Ambulatory Visit (HOSPITAL_BASED_OUTPATIENT_CLINIC_OR_DEPARTMENT_OTHER): Payer: Self-pay

## 2023-12-07 MED ORDER — AMPHETAMINE-DEXTROAMPHETAMINE 30 MG PO TABS
30.0000 mg | ORAL_TABLET | Freq: Two times a day (BID) | ORAL | 0 refills | Status: DC
  Filled 2023-12-11: qty 60, 30d supply, fill #0

## 2023-12-09 ENCOUNTER — Other Ambulatory Visit (HOSPITAL_BASED_OUTPATIENT_CLINIC_OR_DEPARTMENT_OTHER): Payer: Self-pay

## 2023-12-11 ENCOUNTER — Other Ambulatory Visit: Payer: Self-pay

## 2023-12-11 ENCOUNTER — Other Ambulatory Visit (HOSPITAL_BASED_OUTPATIENT_CLINIC_OR_DEPARTMENT_OTHER): Payer: Self-pay

## 2023-12-14 ENCOUNTER — Other Ambulatory Visit (HOSPITAL_BASED_OUTPATIENT_CLINIC_OR_DEPARTMENT_OTHER): Payer: Self-pay

## 2023-12-15 ENCOUNTER — Other Ambulatory Visit (HOSPITAL_BASED_OUTPATIENT_CLINIC_OR_DEPARTMENT_OTHER): Payer: Self-pay

## 2023-12-20 ENCOUNTER — Other Ambulatory Visit (HOSPITAL_BASED_OUTPATIENT_CLINIC_OR_DEPARTMENT_OTHER): Payer: Self-pay

## 2024-01-13 ENCOUNTER — Other Ambulatory Visit (HOSPITAL_BASED_OUTPATIENT_CLINIC_OR_DEPARTMENT_OTHER): Payer: Self-pay

## 2024-01-13 ENCOUNTER — Other Ambulatory Visit (HOSPITAL_COMMUNITY): Payer: Self-pay

## 2024-01-18 ENCOUNTER — Other Ambulatory Visit: Payer: Self-pay

## 2024-02-05 ENCOUNTER — Other Ambulatory Visit (HOSPITAL_BASED_OUTPATIENT_CLINIC_OR_DEPARTMENT_OTHER): Payer: Self-pay

## 2024-02-06 ENCOUNTER — Other Ambulatory Visit (HOSPITAL_BASED_OUTPATIENT_CLINIC_OR_DEPARTMENT_OTHER): Payer: Self-pay

## 2024-02-06 MED ORDER — AMPHETAMINE-DEXTROAMPHETAMINE 30 MG PO TABS
1.0000 | ORAL_TABLET | Freq: Two times a day (BID) | ORAL | 0 refills | Status: DC
  Filled 2024-02-06: qty 60, 30d supply, fill #0

## 2024-02-29 ENCOUNTER — Emergency Department (HOSPITAL_COMMUNITY)
Admission: EM | Admit: 2024-02-29 | Discharge: 2024-03-01 | Disposition: A | Discharge status: Other Acute Inpatient Hospital | Payer: Self-pay | Attending: Emergency Medicine | Admitting: Emergency Medicine

## 2024-02-29 ENCOUNTER — Other Ambulatory Visit (HOSPITAL_BASED_OUTPATIENT_CLINIC_OR_DEPARTMENT_OTHER): Payer: Self-pay

## 2024-02-29 DIAGNOSIS — F1220 Cannabis dependence, uncomplicated: Secondary | ICD-10-CM | POA: Diagnosis present

## 2024-02-29 DIAGNOSIS — F1410 Cocaine abuse, uncomplicated: Secondary | ICD-10-CM | POA: Diagnosis present

## 2024-02-29 DIAGNOSIS — F1510 Other stimulant abuse, uncomplicated: Secondary | ICD-10-CM | POA: Diagnosis present

## 2024-02-29 DIAGNOSIS — Y900 Blood alcohol level of less than 20 mg/100 ml: Secondary | ICD-10-CM | POA: Insufficient documentation

## 2024-02-29 DIAGNOSIS — T424X4A Poisoning by benzodiazepines, undetermined, initial encounter: Secondary | ICD-10-CM | POA: Insufficient documentation

## 2024-02-29 DIAGNOSIS — T424X2A Poisoning by benzodiazepines, intentional self-harm, initial encounter: Secondary | ICD-10-CM | POA: Diagnosis present

## 2024-02-29 DIAGNOSIS — F322 Major depressive disorder, single episode, severe without psychotic features: Secondary | ICD-10-CM | POA: Diagnosis present

## 2024-02-29 DIAGNOSIS — F332 Major depressive disorder, recurrent severe without psychotic features: Secondary | ICD-10-CM | POA: Diagnosis not present

## 2024-02-29 DIAGNOSIS — F17210 Nicotine dependence, cigarettes, uncomplicated: Secondary | ICD-10-CM | POA: Insufficient documentation

## 2024-02-29 DIAGNOSIS — F1010 Alcohol abuse, uncomplicated: Secondary | ICD-10-CM | POA: Diagnosis present

## 2024-02-29 DIAGNOSIS — F411 Generalized anxiety disorder: Secondary | ICD-10-CM | POA: Diagnosis present

## 2024-02-29 LAB — CBG MONITORING, ED: Glucose-Capillary: 91 mg/dL (ref 70–99)

## 2024-02-29 NOTE — ED Notes (Signed)
 ED Provider at bedside.

## 2024-02-29 NOTE — ED Triage Notes (Signed)
 Pt bib [facility] coming [employer]. Staff called EMS for abnormal behavior displayed by patient. Pt does have hx of illicit drug use but does not report any to EMS today. Pt presents confused, unable to follow commands.   EMS: 18 LAC 2mg  IV narcan 130/80 90HR 97% RA 103 cbg

## 2024-03-01 ENCOUNTER — Encounter (HOSPITAL_COMMUNITY): Payer: Self-pay | Admitting: Psychiatry

## 2024-03-01 ENCOUNTER — Other Ambulatory Visit (HOSPITAL_COMMUNITY)
Admission: EM | Admit: 2024-03-01 | Discharge: 2024-03-02 | Discharge status: Against Medical Advice | Attending: Psychiatry | Admitting: Psychiatry

## 2024-03-01 DIAGNOSIS — T424X1A Poisoning by benzodiazepines, accidental (unintentional), initial encounter: Secondary | ICD-10-CM | POA: Diagnosis not present

## 2024-03-01 DIAGNOSIS — T424X2A Poisoning by benzodiazepines, intentional self-harm, initial encounter: Secondary | ICD-10-CM | POA: Diagnosis not present

## 2024-03-01 DIAGNOSIS — F419 Anxiety disorder, unspecified: Secondary | ICD-10-CM | POA: Insufficient documentation

## 2024-03-01 DIAGNOSIS — F1010 Alcohol abuse, uncomplicated: Secondary | ICD-10-CM

## 2024-03-01 DIAGNOSIS — F1014 Alcohol abuse with alcohol-induced mood disorder: Secondary | ICD-10-CM | POA: Diagnosis not present

## 2024-03-01 DIAGNOSIS — F1210 Cannabis abuse, uncomplicated: Secondary | ICD-10-CM | POA: Insufficient documentation

## 2024-03-01 DIAGNOSIS — F1910 Other psychoactive substance abuse, uncomplicated: Secondary | ICD-10-CM

## 2024-03-01 DIAGNOSIS — F332 Major depressive disorder, recurrent severe without psychotic features: Secondary | ICD-10-CM | POA: Diagnosis not present

## 2024-03-01 DIAGNOSIS — F322 Major depressive disorder, single episode, severe without psychotic features: Secondary | ICD-10-CM | POA: Diagnosis present

## 2024-03-01 DIAGNOSIS — T510X1A Toxic effect of ethanol, accidental (unintentional), initial encounter: Secondary | ICD-10-CM | POA: Diagnosis not present

## 2024-03-01 DIAGNOSIS — F1510 Other stimulant abuse, uncomplicated: Secondary | ICD-10-CM | POA: Insufficient documentation

## 2024-03-01 DIAGNOSIS — F1220 Cannabis dependence, uncomplicated: Secondary | ICD-10-CM

## 2024-03-01 DIAGNOSIS — Z5902 Unsheltered homelessness: Secondary | ICD-10-CM | POA: Insufficient documentation

## 2024-03-01 DIAGNOSIS — F1410 Cocaine abuse, uncomplicated: Secondary | ICD-10-CM | POA: Diagnosis not present

## 2024-03-01 DIAGNOSIS — Z560 Unemployment, unspecified: Secondary | ICD-10-CM | POA: Insufficient documentation

## 2024-03-01 DIAGNOSIS — F1314 Sedative, hypnotic or anxiolytic abuse with sedative, hypnotic or anxiolytic-induced mood disorder: Secondary | ICD-10-CM | POA: Insufficient documentation

## 2024-03-01 DIAGNOSIS — F1994 Other psychoactive substance use, unspecified with psychoactive substance-induced mood disorder: Secondary | ICD-10-CM

## 2024-03-01 DIAGNOSIS — F17200 Nicotine dependence, unspecified, uncomplicated: Secondary | ICD-10-CM | POA: Insufficient documentation

## 2024-03-01 DIAGNOSIS — F909 Attention-deficit hyperactivity disorder, unspecified type: Secondary | ICD-10-CM | POA: Insufficient documentation

## 2024-03-01 DIAGNOSIS — F411 Generalized anxiety disorder: Secondary | ICD-10-CM | POA: Diagnosis present

## 2024-03-01 LAB — COMPREHENSIVE METABOLIC PANEL WITH GFR
ALT: 20 U/L (ref 0–44)
AST: 20 U/L (ref 15–41)
Albumin: 3.7 g/dL (ref 3.5–5.0)
Alkaline Phosphatase: 51 U/L (ref 38–126)
Anion gap: 11 (ref 5–15)
BUN: 15 mg/dL (ref 6–20)
CO2: 25 mmol/L (ref 22–32)
Calcium: 9.5 mg/dL (ref 8.9–10.3)
Chloride: 104 mmol/L (ref 98–111)
Creatinine, Ser: 0.93 mg/dL (ref 0.61–1.24)
GFR, Estimated: >60 mL/min (ref >60)
Glucose, Bld: 92 mg/dL (ref 70–99)
Potassium: 3.7 mmol/L (ref 3.5–5.1)
Sodium: 140 mmol/L (ref 135–145)
Total Bilirubin: 1.3 mg/dL — ABNORMAL HIGH (ref 0.0–1.2)
Total Protein: 6.7 g/dL (ref 6.5–8.1)

## 2024-03-01 LAB — CBC
HCT: 44.7 % (ref 39.0–52.0)
Hemoglobin: 14.9 g/dL (ref 13.0–17.0)
MCH: 31.2 pg (ref 26.0–34.0)
MCHC: 33.3 g/dL (ref 30.0–36.0)
MCV: 93.5 fL (ref 80.0–100.0)
Platelets: 283 10*3/uL (ref 150–400)
RBC: 4.78 MIL/uL (ref 4.22–5.81)
RDW: 12.8 % (ref 11.5–15.5)
WBC: 12.4 10*3/uL — ABNORMAL HIGH (ref 4.0–10.5)
nRBC: 0 % (ref 0.0–0.2)

## 2024-03-01 LAB — SALICYLATE LEVEL: Salicylate Lvl: <7 mg/dL — ABNORMAL LOW (ref 7.0–30.0)

## 2024-03-01 LAB — RAPID URINE DRUG SCREEN, HOSP PERFORMED
Amphetamines: POSITIVE — AB
Barbiturates: NOT DETECTED
Benzodiazepines: POSITIVE — AB
Cocaine: POSITIVE — AB
Opiates: NOT DETECTED
Tetrahydrocannabinol: POSITIVE — AB

## 2024-03-01 LAB — ETHANOL: Alcohol, Ethyl (B): <15 mg/dL (ref <15)

## 2024-03-01 LAB — ACETAMINOPHEN LEVEL: Acetaminophen (Tylenol), Serum: <10 ug/mL — ABNORMAL LOW (ref 10–30)

## 2024-03-01 MED ORDER — AMPHETAMINE-DEXTROAMPHETAMINE 10 MG PO TABS
30.0000 mg | ORAL_TABLET | Freq: Two times a day (BID) | ORAL | Status: DC
  Administered 2024-03-01 – 2024-03-02 (×2): 30 mg via ORAL
  Filled 2024-03-01 (×2): qty 3

## 2024-03-01 MED ORDER — DIPHENHYDRAMINE HCL 50 MG/ML IJ SOLN: 50.0000 mg | Freq: Three times a day (TID) | INTRAMUSCULAR | Status: DC | PRN

## 2024-03-01 MED ORDER — HALOPERIDOL LACTATE 5 MG/ML IJ SOLN: 10.0000 mg | Freq: Three times a day (TID) | INTRAMUSCULAR | Status: DC | PRN

## 2024-03-01 MED ORDER — THIAMINE MONONITRATE 100 MG PO TABS
100.0000 mg | ORAL_TABLET | Freq: Every day | ORAL | Status: DC
  Administered 2024-03-02: 100 mg via ORAL
  Filled 2024-03-01: qty 1

## 2024-03-01 MED ORDER — ESCITALOPRAM OXALATE 10 MG PO TABS
5.0000 mg | ORAL_TABLET | Freq: Every day | ORAL | Status: DC
  Administered 2024-03-01: 5 mg via ORAL
  Filled 2024-03-01: qty 1

## 2024-03-01 MED ORDER — TRAZODONE HCL 50 MG PO TABS
50.0000 mg | ORAL_TABLET | Freq: Every evening | ORAL | Status: DC | PRN
  Administered 2024-03-01: 50 mg via ORAL
  Filled 2024-03-01: qty 1

## 2024-03-01 MED ORDER — LOPERAMIDE HCL 2 MG PO CAPS: 2.0000 mg | ORAL_CAPSULE | ORAL | Status: DC | PRN

## 2024-03-01 MED ORDER — LORAZEPAM 2 MG/ML IJ SOLN: 2.0000 mg | Freq: Three times a day (TID) | INTRAMUSCULAR | Status: DC | PRN

## 2024-03-01 MED ORDER — NICOTINE 21 MG/24HR TD PT24
21.0000 mg | MEDICATED_PATCH | Freq: Every day | TRANSDERMAL | Status: DC
  Administered 2024-03-02: 21 mg via TRANSDERMAL
  Filled 2024-03-01: qty 1

## 2024-03-01 MED ORDER — LORAZEPAM 1 MG PO TABS: 1.0000 mg | ORAL_TABLET | Freq: Four times a day (QID) | ORAL | Status: DC | PRN

## 2024-03-01 MED ORDER — DIPHENHYDRAMINE HCL 50 MG PO CAPS: 50.0000 mg | ORAL_CAPSULE | Freq: Three times a day (TID) | ORAL | Status: DC | PRN

## 2024-03-01 MED ORDER — ACETAMINOPHEN 325 MG PO TABS: 650.0000 mg | ORAL_TABLET | Freq: Four times a day (QID) | ORAL | Status: DC | PRN

## 2024-03-01 MED ORDER — CLONAZEPAM 0.5 MG PO TABS: 1.0000 mg | ORAL_TABLET | Freq: Once | ORAL | Status: DC | PRN

## 2024-03-01 MED ORDER — HALOPERIDOL 5 MG PO TABS: 5.0000 mg | ORAL_TABLET | Freq: Three times a day (TID) | ORAL | Status: DC | PRN

## 2024-03-01 MED ORDER — HYDROXYZINE HCL 25 MG PO TABS: 25.0000 mg | ORAL_TABLET | Freq: Four times a day (QID) | ORAL | Status: DC | PRN

## 2024-03-01 MED ORDER — CLONAZEPAM 1 MG PO TABS
1.0000 mg | ORAL_TABLET | Freq: Once | ORAL | Status: AC | PRN
  Administered 2024-03-01: 1 mg via ORAL
  Filled 2024-03-01: qty 1

## 2024-03-01 MED ORDER — AMPHETAMINE-DEXTROAMPHETAMINE 10 MG PO TABS: 30.0000 mg | ORAL_TABLET | Freq: Two times a day (BID) | ORAL | Status: DC

## 2024-03-01 MED ORDER — ONDANSETRON 4 MG PO TBDP: 4.0000 mg | ORAL_TABLET | Freq: Four times a day (QID) | ORAL | Status: DC | PRN

## 2024-03-01 MED ORDER — THIAMINE MONONITRATE 100 MG PO TABS: 100.0000 mg | ORAL_TABLET | Freq: Every day | ORAL | Status: DC

## 2024-03-01 MED ORDER — MAGNESIUM HYDROXIDE 400 MG/5ML PO SUSP: 30.0000 mL | Freq: Every day | ORAL | Status: DC | PRN

## 2024-03-01 MED ORDER — ALUM & MAG HYDROXIDE-SIMETH 200-200-20 MG/5ML PO SUSP: 30.0000 mL | ORAL | Status: DC | PRN

## 2024-03-01 MED ORDER — THIAMINE HCL 100 MG/ML IJ SOLN
100.0000 mg | Freq: Once | INTRAMUSCULAR | Status: AC
  Administered 2024-03-01: 100 mg via INTRAMUSCULAR
  Filled 2024-03-01: qty 2

## 2024-03-01 MED ORDER — HALOPERIDOL LACTATE 5 MG/ML IJ SOLN: 5.0000 mg | Freq: Three times a day (TID) | INTRAMUSCULAR | Status: DC | PRN

## 2024-03-01 MED ORDER — NICOTINE 21 MG/24HR TD PT24
21.0000 mg | MEDICATED_PATCH | Freq: Every day | TRANSDERMAL | Status: DC
  Administered 2024-03-01: 21 mg via TRANSDERMAL
  Filled 2024-03-01: qty 1

## 2024-03-01 MED ORDER — ESCITALOPRAM OXALATE 5 MG PO TABS
5.0000 mg | ORAL_TABLET | Freq: Every day | ORAL | Status: DC
  Filled 2024-03-01: qty 1

## 2024-03-01 MED ORDER — TRAZODONE HCL 50 MG PO TABS: 50.0000 mg | ORAL_TABLET | Freq: Every evening | ORAL | Status: DC | PRN

## 2024-03-01 MED ORDER — ADULT MULTIVITAMIN W/MINERALS CH
1.0000 | ORAL_TABLET | Freq: Every day | ORAL | Status: DC
  Administered 2024-03-01: 1 via ORAL
  Filled 2024-03-01: qty 1

## 2024-03-01 MED ORDER — ADULT MULTIVITAMIN W/MINERALS CH
1.0000 | ORAL_TABLET | Freq: Every day | ORAL | Status: DC
  Administered 2024-03-02: 1 via ORAL
  Filled 2024-03-01: qty 1

## 2024-03-01 NOTE — ED Notes (Signed)
 Pt belonging bag containing a shirt, pants and shoes was place in [place] locker number 5. Pt has two security envelopes locking in security( 4 cards,2 id, 2 nose rings,2 metal chains,wallet and iphone(both back and front cracked/broke)

## 2024-03-01 NOTE — Group Note (Signed)
 Group Topic: Change and Accountability  Group Date: 03/01/2024 Start Time: 1700 End Time: 1730 Facilitators: Pennie Box, RN  Department: [facility]  Number of Participants: 7  Group Focus: goals/reality orientation Treatment Modality:  Psychoeducation Interventions utilized were problem solving Purpose: explore maladaptive thinking  Name: William Allison Date of Birth: Oct 13, 1990  MR: [id number]    Level of Participation: active Quality of Participation: attentive, cooperative, and engaged Interactions with others: gave feedback Mood/Affect: appropriate Triggers (if applicable): none Cognition: logical, insightful Progress: Significant Response: to cultivate a growth mindset, pt says he will change his environment, practice better self control Plan: patient will be encouraged to attend future RN groups  Patients Problems:  Patient Active Problem List   Diagnosis Date Noted   Cocaine use disorder, mild, abuse (HCC) 03/01/2024   Alcohol use disorder, mild, abuse 03/01/2024   Suicide attempt by benzodiazepine overdose (HCC) 03/01/2024   GAD (generalized anxiety disorder) 03/01/2024   MDD (major depressive disorder), single episode, severe , no psychosis (HCC) 03/01/2024   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

## 2024-03-01 NOTE — Group Note (Signed)
 Group Topic: Recovery Basics  Group Date: 03/01/2024 Start Time: 1610 End Time: 2015 Facilitators: Wendall Halls B  Department: [facility]  Number of Participants: 4  Group Focus: abuse issues, acceptance, activities of daily living skills, and check in Treatment Modality:  Individual Therapy Interventions utilized were patient education and support Purpose: enhance coping skills, express feelings, and increase insight  Name: William Allison Date of Birth: 04-Jan-1991  MR: [id number]    Level of Participation: active Quality of Participation: attentive and cooperative Interactions with others: gave feedback Mood/Affect: appropriate Triggers (if applicable): NA Cognition: coherent/clear Progress: Gaining insight Response: William Allison he wants to leave in the morning that he just needed to clear his head and that's why he came here.  Plan: patient will be encouraged to keep going to groups.   Patients Problems:  Patient Active Problem List   Diagnosis Date Noted   Cocaine use disorder, mild, abuse (HCC) 03/01/2024   Alcohol use disorder, mild, abuse 03/01/2024   Suicide attempt by benzodiazepine overdose (HCC) 03/01/2024   GAD (generalized anxiety disorder) 03/01/2024   MDD (major depressive disorder), single episode, severe , no psychosis (HCC) 03/01/2024   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

## 2024-03-01 NOTE — ED Notes (Signed)
 Patient observed/assessed at nursing station. Patient alert and oriented x 4. Affect is bright. Patient denies pain. Pt endorsed anxiety. He denies A/V/H. He denies having any thoughts/plan of self harm and harm towards others. Fluid and snack offered. Patient states that appetite has been good throughout the day. Verbalizes no further complaints at this time. Will continue to monitor and support.

## 2024-03-01 NOTE — ED Provider Notes (Signed)
  Physical Exam  BP 125/88   Pulse 86   Temp 97.8 F (36.6 C)   Resp 18   SpO2 100%   Physical Exam  Procedures  Procedures  ED Course / MDM    Medical Decision Making Amount and/or Complexity of Data Reviewed Labs: ordered.   Accepted at behavioral health urgent care.  Ardyce Krone excepting.  Reevaluated prior to transfer       Mozell Arias, MD 03/01/24 1213

## 2024-03-01 NOTE — BH Assessment (Signed)
 LCSW met with patient to assess current mood, affect, physical state, and inquire about needs/goals while here in [facility] and after discharge. Patient reports he/she presented due to having an accidental overdose on his klonopin . He stated he was "here on a vacation from drugs for 3 days". Patient reports he thinks the crack cocaine he was smoking was laced with fentanyl . Patient reports using alcohol daily and crack cocaine almost daily since December. Patient stated this was a new drug for him and "smoking crack was fun but I have more self control over the drug than it does of me". He reports using meth in past and stated he has not used that in months. He stated he had a psychotic event induced by meth use last time he was admitted to [facility].  He reported not wanting to do a residential treatment program and stated he wasn't ready yet for any kind of programs. Stated he did not want to follow up with any outpatient services either. He reported being homeless since August of last year. He reports having good supports of family but his parents will not let him live with them though they let him wash his clothes there and give him money. He stated he has not worked because he didn't have an ID until recently. He stated that he was just released from jail a couple of weeks ago and was serving 3 months for domestic assault and "being violent". Patient stated he is now on probation and is aware he will get drug tested monthly. Patient denies staying in any shelters and usually sleeps under a tree. He stated he was looking for a job now and is hopeful to buy a car soon and get off the streets. He denies having access to transportation, and reports having limited social support. Patient denies any prior history of outpatient or inpatient substance abuse treatment. Patient currently denies any SI/HI/AVH and reports mood as calm and affect is congruent. Patient aware that LCSW will provide him with resources for homeless  shelters as well as substance abuse detox and treatment programs on his AVS. SW made him aware of walk in clinic at [facility]. Patient expressed understanding and appreciation of LCSW assistance. No other needs were reported at this time by patient.
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
## 2024-03-01 NOTE — ED Notes (Signed)
 Patient is sleeping. Respirations equal and unlabored, skin warm and dry. No change in assessment or acuity. Routine safety checks conducted according to facility protocol. Will continue to monitor for safety.

## 2024-03-01 NOTE — Consult Note (Addendum)
 [facility] Psychiatric Consult Initial  Patient Name: .William Allison  MRN: [id number]  DOB: 03-Jul-1991  Consult Order details:  Orders (From admission, onward)     Start     Ordered   03/01/24 0533  CONSULT TO CALL ACT TEAM       Ordering Provider: Edson Graces, MD  Provider:  (Not yet assigned)  Question:  Reason for Consult?  Answer:  Psych consult   03/01/24 0532             Mode of Visit: In person    Psychiatry Consult Evaluation  Service Date: Mar 01, 2024 LOS:  LOS: 0 days  Chief Complaint: "I just wasn't thinking, I was like fuck it"  Primary Psychiatric Diagnoses  MDD (major depressive disorder), single, severe, without psychosis (HCC) 2.   Methamphetamine use disorder, mild, abuse (HCC) 3.   Cannabis use disorder, severe, dependence (HCC) 4.   Cocaine use disorder, mild, abuse (HCC) 5.   Alcohol use disorder, mild, abuse  Assessment   William Allison is a 33 y.o. Caucasian male with a past psychiatric history of polysubstance abuse (I.e., benzodiazepines, methamphetamines, cannabis, cocaine, EtOH), alcohol use disorder, cannabis use disorder, stimulant use disorder, benzodiazepine abuse, substance-induced psychosis, GAD, and ADHD, with pertinent medical comorbidities/history that include none, who presented by way of EMS from [employer], after an overdose on prescribed Klonopin , in the context of psychosocial stressor around a recent break-up. Patient is currently voluntary at this time, and medically clear, per EDP team.  Upon evaluation, patient presents with symptomology that is most consistent with a single episode, severe, of unipolar major depression, without psychotic features, methamphetamine use disorder, mild, abuse, cannabis use disorder, severe, dependence, cocaine use disorder, mild, abuse, alcohol use disorder, mild, abuse, and GAD.  Evidence of this is appreciable from evaluation conducted, where the patient gives insight into his polysubstance abuse, as  well as anxious and depressive symptomology he has been experiencing, both currently and in the past.  Given the patient's recent suicide attempt by way of benzodiazepine overdose, recommendation is for inpatient mental health hospitalization, for safety and stabilization of the patient.  Patient endorses that he is amenable to addressing his mental health with prescribed medications, so will initiate antidepressant at this time, as well as CIWA protocols for potential withdrawal symptomology that could become appreciable.  Diagnoses:  Active Hospital problems: Principal Problem:   Suicide attempt by benzodiazepine overdose (HCC) Active Problems:   Methamphetamine use disorder, mild, abuse (HCC)   Cannabis use disorder, severe, dependence (HCC)   Cocaine use disorder, mild, abuse (HCC)   Alcohol use disorder, mild, abuse   GAD (generalized anxiety disorder)   MDD (major depressive disorder), single episode, severe , no psychosis (HCC)    Plan   #Methamphetamine use disorder, mild, abuse (HCC) #Cannabis use disorder, severe, dependence (HCC) #Cocaine use disorder, mild, abuse (HCC) #Alcohol use disorder, mild, abuse #GAD (generalized anxiety disorder) #MDD (major depressive disorder), single episode, severe , no psychosis (HCC)  ## Psychiatric Medication Recommendations:   - Recommend CIWA protocols for safety - Recommend Lexapro 5 mg p.o. daily - Recommend continuing the patient's current outpatient medications listed below -> Continue/restart Adderall 30 mg p.o. twice daily -> Continue/restart trazodone  50 mg p.o. nightly as needed for sleep -> Continue/restart Klonopin  1 mg p.o. daily as needed for anxiety  ## Medical Decision Making Capacity: Not specifically addressed in this encounter  ## Further Work-up: UDS/UA  ## Disposition:-- We recommend inpatient psychiatric hospitalization  when medically cleared. Patient is under voluntary admission status at this time; please IVC  if attempts to leave hospital.  ## Behavioral / Environmental: -Routine agitation/safety precautions    ## Safety and Observation Level:  - Based on my clinical evaluation, I estimate the patient to be at low risk of self harm in the current setting. - At this time, we recommend  routine. This decision is based on my review of the chart including patient's history and current presentation, interview of the patient, mental status examination, and consideration of suicide risk including evaluating suicidal ideation, plan, intent, suicidal or self-harm behaviors, risk factors, and protective factors. This judgment is based on our ability to directly address suicide risk, implement suicide prevention strategies, and develop a safety plan while the patient is in the clinical setting. Please contact our team if there is a concern that risk level has changed.  CSSR Risk Category:   Suicide Risk Assessment: Patient has following modifiable risk factors for suicide: untreated depression, social isolation, and recklessness, which we are addressing by treatment recommendations. Patient has following non-modifiable or demographic risk factors for suicide: male gender, separation or divorce, and psychiatric hospitalization Patient has the following protective factors against suicide: Access to outpatient mental health care, Supportive family, Supportive friends, no history of suicide attempts, and no history of NSSIB  Thank you for this consult request. Recommendations have been communicated to the primary team.  We will continue to follow at this time.   Volanda Gruber, NP      History of Present Illness   William Allison is a 33 y.o. Caucasian male with a past psychiatric history of polysubstance abuse (I.e., benzodiazepines, methamphetamines, cannabis, cocaine, EtOH), alcohol use disorder, cannabis use disorder, stimulant use disorder, benzodiazepine abuse, substance-induced psychosis, GAD, and ADHD, with  pertinent medical comorbidities/history that include none, who presented by way of EMS from [employer], after an overdose on prescribed Klonopin , in the context of psychosocial stressor around a recent break-up. Patient is currently voluntary at this time, and medically clear, per EDP team.  Patient seen today at the [facility] emergency department for face-to-face psychiatric evaluation.  Upon evaluation, patient endorses that in the context of psychosocial stress around recent break-up with his girlfriend, while at a local [employer] grabbing food to eat, states that he "just wasn't thinking, I was like fuck it" and proceeded to take a whole bottle of Klonopin  in a suicide attempt.  Patient endorses that he is not suicidal currently, and notably even after endorsing that taking a whole bottle of Klonopin  was an impulsive suicide attempt, proceeds to then severely minimize taking a whole bottle of Klonopin , and states that, "I was honestly just trying to really sleep good".  Patient asked about additional psychosocial stressors, to which patient endorses that outside of being chronically homeless over the last 8 months, and largely not having financial means for basic necessities, states that he is, "all good."  Patient then formally asked about his mood, and about any depressive and/or anxious symptomology, to which patient endorses, "what really is depression, because honestly, I would say I am just constantly stressed"; though when asked about anhedonia, insomnia, increased irritability/agitation, fatigue, feelings of worthlessness/inappropriate guilt, decreased concentration, difficulty relaxing, worrying about a variety of things, and difficulties controlling anxiety, patient affirms that he has been progressively worsening in these symptoms and experiencing them, and endorses formally that his current mood is, "stressed".  Patient denies panic attacks.  Patient endorses no homicidal ideations,  denies auditory and/or visual hallucinations, and objectively, does not appear to be presenting with psychotic features, and orientation is intact, without concerns for fluctuations in consciousness.  Patient endorses no history of suicide attempts and/or self-injurious behavior.  Patient endorses no inpatient mental health hospitalizations, but then corrects himself, and states that he has a history of being held at the [facility] for substance-induced psychosis and was prescribed olanzapine  for short period.  Patient endorses formally he is treated for ADHD and generalized anxiety, takes Klonopin  when needed and Adderall daily, denies any history of being treated for depression.  Patient endorses no history of therapy, states that this is not for him.  Patient endorses no history of substance abuse treatment and/or rehabilitation services, though when patient is formally asked about his substance abuse history, patient endorses a significant history of polysubstance abuse, and notably minimizes his use significantly.  Patient endorses no concerning withdrawal symptomology, and upon objective examination, presents with no concerns for illicit substance abuse withdrawal and/or benzodiazepine withdrawal.  Expanding on substance abuse history, patient endorses that since he was last in the hospital around November of last year, states that he has "off and on" used methamphetamines, cocaine, and EtOH, and when expanding on most recent use of these substances, states that he last used methamphetamines and cocaine 3 days ago, last used EtOH yesterday afternoon, and before this use of ETOH, reports 1 week ago.  Expanding on EtOH use history, patient describes a binging pattern of abuse for "years", though endorses he is unable to elaborate further.  Patient additionally endorses cannabis use daily, states that he has been smoking cannabis since middle school with severe tolerance.  Patient endorses daily tobacco use, states he  smokes about a pack a day, "if I am able".  Patient endorses that he is not working and not on disability, gets drugs and cigarettes socially from his girlfriend, friends, and/or family, and gets money in the same manner.  UDS not obtained at this time, but BAL unremarkable.  Patient endorses no history of EtOH withdraw requiring medical hospitalization, presenting with delirium tremens, and/or causing seizures, but does report "wicked" hangovers.  Patient asked to expand on substance abuse history of using all of the above, but is unable to articulate how many "years" he has been abusing substances largely. Patient endorses that he no longer abuses benzodiazepines, states that he currently "hardly" even utilizes his benzodiazepine prescription.   Discussed with patient that given the recent events that transpired, recommendation would be to start medications to help him with the instability of his mental health, as well as to participate in inpatient mental health hospitalization, for safety and stabilization.  Patient verbalized that he was amenable to this, and patient was advised that if he at any point became not amenable to treatment, recommendation would be for involuntary commitment, to which patient verbalized he understood.  Review of Systems  Constitutional:  Positive for malaise/fatigue.  Neurological:  Negative for dizziness, tremors, seizures, loss of consciousness, weakness and headaches.  Psychiatric/Behavioral:  Positive for depression and substance abuse (Cocaine, cannabis, etoh, and meth). Negative for hallucinations and suicidal ideas. The patient is nervous/anxious and has insomnia.   All other systems reviewed and are negative.    Psychiatric and Social History  Psychiatric History:  Information collected from chart review/patient  Prev Dx/Sx: polysubstance abuse (I.e., benzodiazepines, methamphetamines, cannabis, cocaine, EtOH), alcohol use disorder, cannabis use disorder,  stimulant use disorder, benzodiazepine abuse, substance-induced psychosis, GAD, and ADHD Current Psych Provider: Ms.  Arvie Latus physician assistant at [facility] Meds (current): Klonopin  and Adderall Previous Med Trials: Klonopin , Adderall, olanzapine  Therapy: None  Prior Psych Hospitalization: Yes, x 1, [facility] for substance-induced psychosis Prior Self Harm: Yes, this encounter, attempted to overdose on benzodiazepines Prior Violence: None reported  Family Psych History: None reported Family Hx suicide: None reported  Social History:  Developmental Hx: None reported Educational Hx: None reported Occupational Hx: Unemployed Legal Hx: None reported Living Situation: Homeless, 8 months Spiritual Hx: None reported  Access to weapons/lethal means: None reported  Substance History Alcohol: Yesterday, " had a few beers I think" Type of alcohol: Largely beer Last Drink: Yesterday Number of drinks per day: "It just depends, I am not drinking all the time lately" History of alcohol withdrawal seizures: None reported History of DT's: None reported Tobacco: Daily Illicit drugs: Cocaine, amphetamines, marijuana, unable to get clear history Prescription drug abuse: Yes, has a history of buying Xanax off the street, as well as abusing his prescription for Klonopin  Rehab hx: None reported  Exam Findings  Physical Exam: As below Vital Signs:  Temp:  [96.9 F (36.1 C)-97.8 F (36.6 C)] 97.8 F (36.6 C) (05/30 0315) Pulse Rate:  [83-94] 86 (05/30 0315) Resp:  [17-27] 18 (05/30 0315) BP: (106-125)/(86-99) 125/88 (05/30 0315) SpO2:  [94 %-100 %] 100 % (05/30 0315) Blood pressure 125/88, pulse 86, temperature 97.8 F (36.6 C), resp. rate 18, SpO2 100%. There is no height or weight on file to calculate BMI.  Physical Exam Vitals and nursing note reviewed.  Constitutional:      General: He is not in acute distress.    Appearance: He is normal weight. He is not ill-appearing, toxic-appearing  or diaphoretic.  Pulmonary:     Effort: Pulmonary effort is normal.  Skin:    General: Skin is warm and dry.  Neurological:     Mental Status: He is alert and oriented to person, place, and time.     Motor: No weakness, tremor or seizure activity.  Psychiatric:        Attention and Perception: Perception normal. He is inattentive (Mildly inattentive). He does not perceive auditory or visual hallucinations.        Mood and Affect: Mood is depressed.        Speech: Speech normal.        Behavior: Behavior is cooperative.        Thought Content: Thought content normal. Thought content is not paranoid or delusional. Thought content does not include homicidal or suicidal ideation.        Cognition and Memory: Cognition and memory normal.        Judgment: Judgment is impulsive and inappropriate.    Mental Status Exam: General Appearance: Casual and in scrubs  Orientation:  Full (Time, Place, and Person)  Memory: Grossly intact  Concentration:  Concentration: Mildly inattentive and Attention Span: Mildly inattentive  Recall: Grossly intact  Attention  Other: Mildly inattentive  Eye Contact:  Variable  Speech:  Clear and Coherent and Normal Rate  Language:  Fair  Volume:  Normal  Mood: Stressed  Affect:  Neutral to mildly constricted  Thought Process:  Coherent and Linear  Thought Content:  Negative and Logical  Suicidal Thoughts:  No  Homicidal Thoughts:  No  Judgement:  Poor  Insight:  Lacking  Psychomotor Activity:  Normal  Akathisia:  No  Fund of Knowledge:  Fair      Assets:  Manufacturing systems engineer Leisure Time Physical Health Resilience Social Support  Talents/Skills Vocational/Educational  Cognition:  WNL  ADL's:  Intact  AIMS (if indicated):   0     Other History   These have been pulled in through the EMR, reviewed, and updated if appropriate.  Family History:  The patient's family history is not on file.  Medical History: Past Medical History:  Diagnosis  Date   ADD (attention deficit disorder)    Constipation 05/30/12   [facility], Alvis Jourdain; Donnatal q.i.d. Follow up in 4 weeks.   GERD (gastroesophageal reflux disease)     Surgical History: Past Surgical History:  Procedure Laterality Date   HAND SURGERY     for fracture   LAPAROSCOPIC APPENDECTOMY N/A 12/26/2016   Procedure: APPENDECTOMY LAPAROSCOPIC;  Surgeon: Ayesha Lente, MD;  Location: [facility];  Service: General;  Laterality: N/A;   TYMPANOSTOMY TUBE PLACEMENT       Medications:  No current facility-administered medications for this encounter.  Current Outpatient Medications:    amphetamine -dextroamphetamine  (ADDERALL) 30 MG tablet, Take 1 tablet by mouth 2 (two) times daily., Disp: 60 tablet, Rfl: 0   clonazePAM  (KLONOPIN ) 1 MG tablet, Take 0.5-1 tablets (0.5-1 mg total) by mouth once daily., Disp: 30 tablet, Rfl: 2   nicotine  polacrilex (NICORETTE ) 2 MG gum, Take 1 each (2 mg total) by mouth every 4 (four) hours while awake., Disp: 100 tablet, Rfl: 0   traZODone  (DESYREL ) 50 MG tablet, Take 1 tablet (50 mg total) by mouth at bedtime as needed for sleep., Disp: 30 tablet, Rfl: 0  Allergies: No Known Allergies  Volanda Gruber, NP

## 2024-03-01 NOTE — ED Notes (Signed)
 Pt transferred from [facility] to [facility] requesting detox from ETOH and benzo's. Pt states he took an unknown amount of Klonopin  along with another substance in order to get high. Pt states, "I must took too much because I woke up in the hosp. They said I passed out at [employer] (laughing). I would never try to kill myself intentionally. My girlfriend and I got arguing but not to that extreme. I just want 2-3 days to clear my head completely then discharge. I slept my ass off in the hosp. I feel great". Cooperative throughout interview process. Talkative with staff. Skin assessment completed. Oriented to unit. Meal and drink offered. At currrent, pt continue to deny SI/HI/AVH. Pt verbally contract for safety. Will monitor for safety.

## 2024-03-01 NOTE — Discharge Instructions (Addendum)
 Based on the information that you have provided and the presenting issues the following homeless shelter have been provided below.  In case of an urgent crisis, you may contact the Mobile Crisis Unit with [facility] at [phone].              Homeless Shelters in [facility] NIGHT [facility]) [address] [place], [place] Phone: [phone]   [facility] has been providing emergency shelter to those in need of a permanent residence for over 35 years. The [employer] shelter plays an important role in our community.   There are many life events that can pull someone into a downward spiral towards poverty that is very difficult to get out of. Homelessness is a problem that can affect anyone of us . [facility] is a safe and comforting place to stay, especially if you have experienced the hardship of street life.   [employer] provides a single bed and bedding to 100 adult men and women. The shelter welcomes all who are in need of housing, no one in real need is turned away unless space is not available.   While staying at [facility], guests are offered more than just a bed for a night. Hot meals are provided and every guest has access to case management services. Case managers provide assistance with finding housing, employment, or other services that will help them gain stability. Continuous stay is based on availability, capacity, and progress towards goals.   To contact the front desk of [facility] please call  [phone] ext [phone] or ext. [phone].      [facility]EN) [address] [place], [place]  [address] [phone]       [facility] [address], [place], [place] [address] Phone: [phone]     [facility]  [address], [place], [place]   [address] [phone] Population served: Male veterans 18+ with substance abuse/mental health issues Eligibility: By referral only     [facility] (Women only) [address] Margarita Shear [place], [place] [address] Phone: [phone] Population served: Single women 18+ without dependents Documents required:  Valid ID & Social Security Card       The [employer] Address: [address] [place], [place] [address] Hours:   Opens 9?AM Mon Phone: [phone]  The [facility] providers a continuum of housing services to those experiencing homelessness. They provide transitional [employer] and permanent supportive housing ([place]) to disabled veterans experiencing homelessness. There is a fast track Rapid Rehousing program couples housing stability services with temporary financial assistance to quickly house individuals and families experiencing homelessness.   [employer] [address], [place] [address] Phone: [phone]      [facility] [address] Nonnie Beagle [place], [place] [address] Phone: [phone]  Offers food and emergency or transitional housing to men, women, or families in need. Clients participate in programs and workshops developed to promote self-sufficiency and personal development. Call or walk in. Applications are accepted Monday, Wednesday, and Friday by appointment only. Need photo ID and proof of income    [employer] [facility] [employer] [address], [place], [place] [address] [phone] Population served: Men 18+, preference for disabled and/or veterans    [facility]ock  [facility]  [address] [place], [place], [place] [address] [phone] Population served: Families with children.      [facility] at [facility] [place] [facility] [employer]. [address] [place] [address] [phone] or [phone] Population served: Pregnant women with  or without children       [facility] [address] [place], [place] [address] Check in at 6:00PM for placement at a local shelter) Phone: [phone]   [facility] Eligibility:  Must be drug and alcohol free for at least 14 days or more at the time of application. This program serves males.  Houses Engineer, civil (consulting), Economist who serve six-month terms.  Houses are financially self-supporting; members split house expenses, which average $90.00 to $130.00 per person per week.  Any Resident who relapses must be immediately expelled. Call:  [phone]   [facility] Address: [address] [place] Dom[place], [place] [address]  Phone#: [phone]    [facility] Address: [address] [place], [place] [address] Phone: [phone]  -The [facility] provides food, shelter and other programs and services to the homeless men of [place] through our [employer] program.  By offering safe shelter, three meals a day, clean clothing, Biblical counseling, financial planning, vocational training, GED/education and employment assistance, we've helped mend the shattered lives of many homeless men since opening in [place].  We have approximately 267 beds available, with a max of 312 beds including mats for emergency situations and currently house an average of 270 men a night.  Prospective Client Check-In Information Photo ID Required (State/ Out of State/ [facility]) - if photo ID is not available, clients are required to have a printout of a police/sheriff's criminal history report. Help out with chores around the [facility]. No sex offender of any type (pending, charged, registered and/or any other sex related offenses) will be permitted to check in. Must be willing to abide by all rules, regulations, and policies established by the [employer]. The following will be provided - shelter,  food, clothing, and biblical counseling. If you or someone you know is in need of assistance at our [facility] shelter in [place], [place], please call [phone] ext. [phone].  Women Shelter for [employer] hours are Monday-Friday only.    [facility]          -(Please Call) Phone: [phone]   Provides housing and specialized case management focused on housing stability.      Non-Emergent / Urgent   [facility] [address] SECOND FLOOR [place], [place] [address] [phone] OUTPATIENT Walk-in information: Please note, all walk-ins are first come & first serve, with limited number of availability.   Please note that to be eligible for services you must bring: ID or a piece of mail with your name [facility] address   Therapist for therapy:  Monday & Wednesdays: Please ARRIVE at 7:15 AM for registration Will START at 8:00 AM Every 1st & 2nd Friday of the month: Please ARRIVE at 10:15 AM for registration Will START at 1 PM - 5 PM   Psychiatrist for medication management: Monday - Friday:  Please ARRIVE at 7:15 AM for registration Will START at 8:00 AM   Regretfully, due to limited availability, please be aware that you may not been seen on the same day as walk-in. Please consider making an appoint or try again. Thank you for your  patience and understanding. ________________________________________________________   [facility] URGENT [facility]ARE:  [address] FIRST FLOOR.  [place], [place] [address].  [phone]   Mobile Crisis Response Teams Listed by counties in vicinity of [facility] providers [facility]  [facility] [phone] [facility]  [facility] [phone] [facility]  [facility] [phone] [facility]  [facility] [phone] [place]                 * [facility]  [phone]                * [facility] [phone] [facility]  [facility] [phone] [facility]  [phone] * [employer] [phone] ________________________________________________________  LCSW spoke with patient regarding plans at discharge. Patient aware of resources provided at the [facility] Center-Outpatient New Patient Assessment/Therapy Walk ins:. Instructions provided in AVS. Patient expressed appreciation for LCSW assistance with discharge planning. Patient reports feeling safe to discharge and reports having support from family when he/she returns home. No other needs were reported at this time. LCSW to sign off. Please inform if further LCSW needs arise prior to discharge.

## 2024-03-02 DIAGNOSIS — T424X1A Poisoning by benzodiazepines, accidental (unintentional), initial encounter: Secondary | ICD-10-CM | POA: Diagnosis not present

## 2024-03-02 DIAGNOSIS — T510X1A Toxic effect of ethanol, accidental (unintentional), initial encounter: Secondary | ICD-10-CM | POA: Diagnosis not present

## 2024-03-02 DIAGNOSIS — F322 Major depressive disorder, single episode, severe without psychotic features: Secondary | ICD-10-CM | POA: Diagnosis not present

## 2024-03-02 DIAGNOSIS — F1014 Alcohol abuse with alcohol-induced mood disorder: Secondary | ICD-10-CM | POA: Diagnosis not present

## 2024-03-02 NOTE — ED Provider Notes (Signed)
 FBC/OBS ASAP Discharge Summary  Date and Time: 03/02/2024 11:03 AM  Name: William Allison  MRN:  [id number]   Discharge Diagnoses:  Final diagnoses:  Polysubstance abuse (HCC)  Substance induced mood disorder (HCC)    Subjective:  William Allison is a 33 yr old male who presented to [facility] on 5/29 via EMS after being found in his car in a [employer] parking lot, he was admitted to [facility] on 5/30. PPHx is significant for Depression, Anxiety, Polysubstance Abuse (EtOH, Stimulant, Benzo, and THC), and ADHD, and no history of Suicide Attempts, Self Injurious Behavior, or Psychiatric Hospitalizations.   He reports that he took 2 of his Klonopin 's (2 mg total) and was not trying to overdose.  He reports that he smoked a joint given to him and thinks it was laced with something which caused him passing out in his car.  He reports he has been cutting down on his EtOH use.  He reports he is no longer using Meth as the psychosis it caused scared him.  He reports that he got out of jail on Thursday and now has his ID.  He reports that he has a job interview that he needs to get to today.  He reports his friend helped him get this and it is for [employer] in [place], [place].  He reports that this would be a huge help if he can get this job as he would be able to get a place to stay and keep him busy helping him not use substances.    He reports a past psychiatric history significant for Depression, Anxiety, Polysubstance Abuse (EtOH, Stimulant, Benzo, and THC), and ADHD.  He reports no history of Suicide Attempts.  He reports no history of self-injurious behavior.  He reports no history of psychiatric hospitalizations.  He reports no significant past medical history.  He reports past surgical history significant for appendectomy and tea tubes.  He reports no history of seizures.  He reports no history of head trauma.  He reports NKDA.  He reports family history of significant for: Father- EtOH Abuse, and no known diagnoses or  suicides.   He reports he is currently unhoused.  He reports he graduated high school.  He reports he is still drinking some.  He reports he does smoke tobacco daily.  He reports he is still using THC and "other stuff" but reports no more meth use.  He reports he is currently on probation for 1 year.  He reports that he does not currently have access to firearms as his uncle took position of all of his firearms as part of his plea deal.   Discussed with him that we would recommend he stay to begin Lexapro  and to receive further assistance for substance abuse.  He reports he is not interested in that at this time.  Discussed with him that if he were to sign out AMA we would recommend that he return during walk-in hours for outpatient treatment and he reports he is agreeable to this.  Discussed with him that his continued Klonopin  and Adderall use was not recommended given his history of substance use.  He reports no SI, HI, or AVH.  He reports his sleep was good last night.  He reports his appetite is good.  He reports other concerns at present.  Stay Summary:  He was admitted to [facility] on 5/30.  He declined treatment and requested discharge on 5/31.  Discussed that he would benefit from continued admission  and treatment for depression and substance abuse but he declined and signed AMA.  He was agreeable for outpatient treatment and was given walk-in information.  Total Time spent with patient: 45 minutes  Past Psychiatric History: Depression, Anxiety, Polysubstance Abuse (EtOH, Stimulant, Benzo, and THC), and ADHD, and no history of Suicide Attempts, Self Injurious Behavior, or Psychiatric Hospitalizations.  Past Medical History: Report None  Family History: Report None  Family Psychiatric History: Father- EtOH. No Known Diagnosis' or Suicides. Social History: Unhoused. On probation. Currently unemployed.  Tobacco Cessation:  A prescription for an FDA-approved tobacco cessation medication was offered at  discharge and the patient refused  Current Medications:  Current Facility-Administered Medications  Medication Dose Route Frequency Provider Last Rate Last Admin   acetaminophen  (TYLENOL ) tablet 650 mg  650 mg Oral Q6H PRN Volanda Gruber, NP       alum & mag hydroxide-simeth (MAALOX/MYLANTA) 200-200-20 MG/5ML suspension 30 mL  30 mL Oral Q4H PRN Volanda Gruber, NP       amphetamine -dextroamphetamine  (ADDERALL) tablet 30 mg  30 mg Oral BID WC Volanda Gruber, NP   30 mg at 03/02/24 5573   haloperidol  (HALDOL ) tablet 5 mg  5 mg Oral TID PRN Volanda Gruber, NP       And   diphenhydrAMINE  (BENADRYL ) capsule 50 mg  50 mg Oral TID PRN Volanda Gruber, NP       haloperidol  lactate (HALDOL ) injection 5 mg  5 mg Intramuscular TID PRN Volanda Gruber, NP       And   diphenhydrAMINE  (BENADRYL ) injection 50 mg  50 mg Intramuscular TID PRN Volanda Gruber, NP       And   LORazepam  (ATIVAN ) injection 2 mg  2 mg Intramuscular TID PRN Volanda Gruber, NP       haloperidol  lactate (HALDOL ) injection 10 mg  10 mg Intramuscular TID PRN Volanda Gruber, NP       And   diphenhydrAMINE  (BENADRYL ) injection 50 mg  50 mg Intramuscular TID PRN Volanda Gruber, NP       And   LORazepam  (ATIVAN ) injection 2 mg  2 mg Intramuscular TID PRN Volanda Gruber, NP       escitalopram  (LEXAPRO ) tablet 5 mg  5 mg Oral Daily Volanda Gruber, NP       hydrOXYzine  (ATARAX ) tablet 25 mg  25 mg Oral Q6H PRN Volanda Gruber, NP       loperamide  (IMODIUM ) capsule 2-4 mg  2-4 mg Oral PRN Volanda Gruber, NP       LORazepam  (ATIVAN ) tablet 1 mg  1 mg Oral Q6H PRN Volanda Gruber, NP       magnesium  hydroxide (MILK OF MAGNESIA) suspension 30 mL  30 mL Oral Daily PRN Volanda Gruber, NP       multivitamin with minerals tablet 1 tablet  1 tablet Oral Daily Volanda Gruber, NP   1 tablet at 03/02/24 1013   nicotine  (NICODERM CQ  - dosed in mg/24 hours) patch 21 mg  21 mg Transdermal Daily Volanda Gruber, NP   21 mg at  03/02/24 2202   ondansetron  (ZOFRAN -ODT) disintegrating tablet 4 mg  4 mg Oral Q6H PRN Volanda Gruber, NP       thiamine  (VITAMIN B1) tablet 100 mg  100 mg Oral Daily Volanda Gruber, NP   100 mg at 03/02/24 1013   traZODone  (DESYREL ) tablet 50 mg  50 mg Oral  QHS PRN Volanda Gruber, NP   50 mg at 03/01/24 2105   Current Outpatient Medications  Medication Sig Dispense Refill   amphetamine -dextroamphetamine  (ADDERALL) 30 MG tablet Take 1 tablet by mouth 2 (two) times daily. 60 tablet 0   clonazePAM  (KLONOPIN ) 1 MG tablet Take 0.5-1 tablets (0.5-1 mg total) by mouth once daily. 30 tablet 2    PTA Medications:  Facility Ordered Medications  Medication   [COMPLETED] thiamine  (VITAMIN B1) injection 100 mg   hydrOXYzine  (ATARAX ) tablet 25 mg   loperamide  (IMODIUM ) capsule 2-4 mg   LORazepam  (ATIVAN ) tablet 1 mg   multivitamin with minerals tablet 1 tablet   ondansetron  (ZOFRAN -ODT) disintegrating tablet 4 mg   thiamine  (VITAMIN B1) tablet 100 mg   amphetamine -dextroamphetamine  (ADDERALL) tablet 30 mg   [COMPLETED] clonazePAM  (KLONOPIN ) tablet 1 mg   escitalopram  (LEXAPRO ) tablet 5 mg   nicotine  (NICODERM CQ  - dosed in mg/24 hours) patch 21 mg   traZODone  (DESYREL ) tablet 50 mg   acetaminophen  (TYLENOL ) tablet 650 mg   alum & mag hydroxide-simeth (MAALOX/MYLANTA) 200-200-20 MG/5ML suspension 30 mL   magnesium  hydroxide (MILK OF MAGNESIA) suspension 30 mL   haloperidol  (HALDOL ) tablet 5 mg   And   diphenhydrAMINE  (BENADRYL ) capsule 50 mg   haloperidol  lactate (HALDOL ) injection 5 mg   And   diphenhydrAMINE  (BENADRYL ) injection 50 mg   And   LORazepam  (ATIVAN ) injection 2 mg   haloperidol  lactate (HALDOL ) injection 10 mg   And   diphenhydrAMINE  (BENADRYL ) injection 50 mg   And   LORazepam  (ATIVAN ) injection 2 mg   PTA Medications  Medication Sig   clonazePAM  (KLONOPIN ) 1 MG tablet Take 0.5-1 tablets (0.5-1 mg total) by mouth once daily.   amphetamine -dextroamphetamine   (ADDERALL) 30 MG tablet Take 1 tablet by mouth 2 (two) times daily.       03/02/2024   10:31 AM 08/12/2023   10:55 AM 08/10/2023   10:58 AM  Depression screen PHQ 2/9  Decreased Interest 0 0 0  Down, Depressed, Hopeless 1 0 0  PHQ - 2 Score 1 0 0  Altered sleeping 1    Tired, decreased energy 1    Change in appetite 0    Feeling bad or failure about yourself  2    Trouble concentrating 2    Moving slowly or fidgety/restless 0    Suicidal thoughts 0    PHQ-9 Score 7      Flowsheet Row ED from [facility]01[facility] from 08/09/2023 in [facility] ED from 08/08/2023 in [facility] Emergency Department at [facility]  C-SSRS RISK CATEGORY No Risk No Risk No Risk       Musculoskeletal  Strength & Muscle Tone: within normal limits Gait & Station: normal Patient leans: N/A  Psychiatric Specialty Exam  Presentation  General Appearance:  Appropriate for Environment; Casual  Eye Contact: Good  Speech: Clear and Coherent; Normal Rate  Speech Volume: Normal  Handedness: Right   Mood and Affect  Mood: -- ("ok")  Affect: Congruent   Thought Process  Thought Processes: Coherent; Goal Directed  Descriptions of Associations:Intact  Orientation:Full (Time, Place and Person)  Thought Content:Logical; WDL  Diagnosis of Schizophrenia or Schizoaffective disorder in past: No    Hallucinations:Hallucinations: None  Ideas of Reference:None  Suicidal Thoughts:Suicidal Thoughts: No  Homicidal Thoughts:Homicidal Thoughts: No   Sensorium  Memory: Immediate Fair; Recent Fair  Judgment: Fair  Insight: Fair   Art therapist  Concentration: Good  Attention Span: Good  Recall: Good  Fund of Knowledge: Good  Language: Good   Psychomotor Activity  Psychomotor Activity: Psychomotor Activity: Normal   Assets  Assets: Communication Skills; Physical Health;  Resilience   Sleep  Sleep: Sleep: Good   Nutritional Assessment (For OBS and FBC admissions only) Has the patient had a weight loss or gain of 10 pounds or more in the last 3 months?: No Has the patient had a decrease in food intake/or appetite?: No Does the patient have dental problems?: No Does the patient have eating habits or behaviors that may be indicators of an eating disorder including binging or inducing vomiting?: No Has the patient recently lost weight without trying?: 0 Has the patient been eating poorly because of a decreased appetite?: 0 Malnutrition Screening Tool Score: 0    Physical Exam  Physical Exam Vitals and nursing note reviewed.  Constitutional:      General: He is not in acute distress.    Appearance: Normal appearance. He is normal weight. He is not ill-appearing or toxic-appearing.  HENT:     Head: Normocephalic and atraumatic.  Pulmonary:     Effort: Pulmonary effort is normal.  Musculoskeletal:        General: Normal range of motion.  Neurological:     General: No focal deficit present.     Mental Status: He is alert.    Review of Systems  Respiratory:  Negative for cough and shortness of breath.   Cardiovascular:  Negative for chest pain.  Gastrointestinal:  Negative for abdominal pain, constipation, diarrhea, nausea and vomiting.  Neurological:  Negative for dizziness, weakness and headaches.  Psychiatric/Behavioral:  Negative for depression, hallucinations and suicidal ideas. The patient is not nervous/anxious.    Blood pressure 115/70, pulse (!) 103, temperature 97.7 F (36.5 C), temperature source Oral, resp. rate 18, SpO2 99%. There is no height or weight on file to calculate BMI.  Demographic Factors:  Male, Caucasian, Low socioeconomic status, and Living alone  Loss Factors: Legal issues  Historical Factors: NA  Risk Reduction Factors:   NA  Continued Clinical Symptoms:  Alcohol/Substance Abuse/Dependencies Unstable or  Poor Therapeutic Relationship Previous Psychiatric Diagnoses and Treatments  Cognitive Features That Contribute To Risk:  Closed-mindedness    Suicide Risk:  Minimal: No identifiable suicidal ideation.  Patients presenting with no risk factors but with morbid ruminations; may be classified as minimal risk based on the severity of the depressive symptoms  Plan Of Care/Follow-up recommendations:  Activity: as tolerated  Diet: heart healthy  Other: -Follow-up with your outpatient psychiatric provider -instructions on appointment date, time, and address (location) are provided to you in discharge paperwork.  -Take your psychiatric medications as prescribed at discharge - instructions are provided to you in the discharge paperwork  -Follow-up with outpatient primary care doctor and other specialists -for management of chronic medical disease, including: Routine Medical Care  -Testing: Follow-up with outpatient provider for abnormal lab results: None  -Recommend abstinence from alcohol, tobacco, and other illicit drug use at discharge.   -If your psychiatric symptoms recur, worsen, or if you have side effects to your psychiatric medications, call your outpatient psychiatric provider, 911, 988 or go to the nearest emergency department.  -If suicidal thoughts recur, call your outpatient psychiatric provider, 911, 988 or go to the nearest emergency department.   Disposition: Discharge AMA  Basilia Bosworth, DO 03/02/2024, 11:03 AM

## 2024-03-02 NOTE — ED Notes (Signed)
 Patient is sleeping. Respirations equal and unlabored, skin warm and dry. No change in assessment or acuity. Routine safety checks conducted according to facility protocol. Will continue to monitor for safety.

## 2024-03-02 NOTE — Group Note (Signed)
 Group Topic: Healthy Self Image and Positive Change  Group Date: 03/02/2024 Start Time: 1000 End Time: 1100 Facilitators: Milan Alfred, NT MHT 2 Department: [facility]  Number of Participants: 4  Group Focus: coping skills Treatment Modality:  Interpersonal Therapy Interventions utilized were reminiscence Purpose: express irrational fears  Name: William Allison Date of Birth: 1991-08-02  MR: [id number]    Level of Participation: active Quality of Participation: attentive Interactions with others: gave feedback Mood/Affect: positive Triggers (if applicable): N/A Cognition: coherent/clear Progress: Moderate Response: Appropriate  Plan: patient will be encouraged to continue to be positive   Patients Problems:  Patient Active Problem List   Diagnosis Date Noted   Cocaine use disorder, mild, abuse (HCC) 03/01/2024   Alcohol use disorder, mild, abuse 03/01/2024   Suicide attempt by benzodiazepine overdose (HCC) 03/01/2024   GAD (generalized anxiety disorder) 03/01/2024   MDD (major depressive disorder), single episode, severe , no psychosis (HCC) 03/01/2024   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

## 2024-03-02 NOTE — ED Notes (Signed)
 Pt request to discharge. Pt speaking with provider and provider requested pt sign AMA form. Form given to pt and completed.

## 2024-03-02 NOTE — ED Notes (Addendum)
 Patient A&Ox4. Denies intent to harm self/others when asked. Denies A/VH. Patient denies any physical complaints when asked. Pt seeking Klonopin . Writer informed pt that the medication was not ordered. Pt states, "Oh, they are gonna have to order it. I'll talk to them". Support and encouragement provided. Provider made aware. Routine safety checks conducted according to facility protocol. Encouraged patient to notify staff if thoughts of harm toward self or others arise. Patient verbalize understanding and agreement. Will continue to monitor for safety.

## 2024-03-02 NOTE — ED Notes (Signed)
 Pt wants to leave AMA first thing this morning.

## 2024-03-02 NOTE — ED Provider Notes (Signed)
 Facility Based Crisis Admission H&P  Date: 03/02/24 Patient Name: William Allison MRN: [id number] Chief Complaint: "I would like to be discharged"  Diagnoses:  Final diagnoses:  Polysubstance abuse (HCC)  Substance induced mood disorder (HCC)    HPI:  William Allison is a 33 yr old male who presented to [facility] on 5/29 via EMS after being found in his car in a [employer] parking lot, he was admitted to [facility] on 5/30. PPHx is significant for Depression, Anxiety, Polysubstance Abuse (EtOH, Stimulant, Benzo, and THC), and ADHD, and no history of Suicide Attempts, Self Injurious Behavior, or Psychiatric Hospitalizations.  He reports that he took 2 of his Klonopin 's (2 mg total) and was not trying to overdose.  He reports that he smoked a joint given to him and thinks it was laced with something which caused him passing out in his car.  He reports he has been cutting down on his EtOH use.  He reports he is no longer using Meth as the psychosis it caused scared him.  He reports that he got out of jail on Thursday and now has his ID.  He reports that he has a job interview that he needs to get to today.  He reports his friend helped him get this and it is for [employer] in [place], [place].  He reports that this would be a huge help if he can get this job as he would be able to get a place to stay and keep him busy helping him not use substances.   He reports a past psychiatric history significant for Depression, Anxiety, Polysubstance Abuse (EtOH, Stimulant, Benzo, and THC), and ADHD.  He reports no history of Suicide Attempts.  He reports no history of self-injurious behavior.  He reports no history of psychiatric hospitalizations.  He reports no significant past medical history.  He reports past surgical history significant for appendectomy and tea tubes.  He reports no history of seizures.  He reports no history of head trauma.  He reports NKDA.  He reports family history of significant for: Father- EtOH Abuse, and no  known diagnoses or suicides.  He reports he is currently unhoused.  He reports he graduated high school.  He reports he is still drinking some.  He reports he does smoke tobacco daily.  He reports he is still using THC and "other stuff" but reports no more meth use.  He reports he is currently on probation for 1 year.  He reports that he does not currently have access to firearms as his uncle took position of all of his firearms as part of his plea deal.  Discussed with him that we would recommend he stay to begin Lexapro  and to receive further assistance for substance abuse.  He reports he is not interested in that at this time.  Discussed with him that if he were to sign out AMA we would recommend that he return during walk-in hours for outpatient treatment and he reports he is agreeable to this.  Discussed with him that his continued Klonopin  and Adderall use was not recommended given his history of substance use.  He reports no SI, HI, or AVH.  He reports his sleep was good last night.  He reports his appetite is good.  He reports other concerns at present.   PHQ 2-9:  Flowsheet Row ED from 03/01/2024 in [facility]  Thoughts that you would be better off dead, or of hurting yourself in some way  Not at all  PHQ-9 Total Score 7       Flowsheet Row ED from 03/01/2024 in [facility] ED from 08/09/2023 in [facility] ED from 08/08/2023 in [facility] Emergency Department at [facility]  C-SSRS RISK CATEGORY No Risk No Risk No Risk       Screenings    Flowsheet Row Most Recent Value  CIWA-Ar Total 2       Total Time spent with patient: 1 hour  Musculoskeletal  Strength & Muscle Tone: within normal limits Gait & Station: normal Patient leans: N/A  Psychiatric Specialty Exam  Presentation General Appearance:  Appropriate for Environment; Casual  Eye Contact: Good  Speech: Clear and  Coherent; Normal Rate  Speech Volume: Normal  Handedness: Right   Mood and Affect  Mood: -- ("ok")  Affect: Congruent   Thought Process  Thought Processes: Coherent; Goal Directed  Descriptions of Associations:Intact  Orientation:Full (Time, Place and Person)  Thought Content:Logical; WDL  Diagnosis of Schizophrenia or Schizoaffective disorder in past: No   Hallucinations:Hallucinations: None  Ideas of Reference:None  Suicidal Thoughts:Suicidal Thoughts: No  Homicidal Thoughts:Homicidal Thoughts: No   Sensorium  Memory: Immediate Fair; Recent Fair  Judgment: Fair  Insight: Fair   Art therapist  Concentration: Good  Attention Span: Good  Recall: Good  Fund of Knowledge: Good  Language: Good   Psychomotor Activity  Psychomotor Activity:Psychomotor Activity: Normal   Assets  Assets: Communication Skills; Physical Health; Resilience   Sleep  Sleep:Sleep: Good   Nutritional Assessment (For OBS and FBC admissions only) Has the patient had a weight loss or gain of 10 pounds or more in the last 3 months?: No Has the patient had a decrease in food intake/or appetite?: No Does the patient have dental problems?: No Does the patient have eating habits or behaviors that may be indicators of an eating disorder including binging or inducing vomiting?: No Has the patient recently lost weight without trying?: 0 Has the patient been eating poorly because of a decreased appetite?: 0 Malnutrition Screening Tool Score: 0    Physical Exam Vitals and nursing note reviewed.  Constitutional:      General: He is not in acute distress.    Appearance: Normal appearance. He is normal weight. He is not ill-appearing or toxic-appearing.  HENT:     Head: Normocephalic and atraumatic.  Pulmonary:     Effort: Pulmonary effort is normal.  Musculoskeletal:        General: Normal range of motion.  Neurological:     General: No focal deficit  present.     Mental Status: He is alert.    Review of Systems  Respiratory:  Negative for cough and shortness of breath.   Cardiovascular:  Negative for chest pain.  Gastrointestinal:  Negative for abdominal pain, constipation, diarrhea, nausea and vomiting.  Neurological:  Negative for dizziness, weakness and headaches.  Psychiatric/Behavioral:  Negative for depression, hallucinations and suicidal ideas. The patient is not nervous/anxious.     Blood pressure 115/70, pulse (!) 103, temperature 97.7 F (36.5 C), temperature source Oral, resp. rate 18, SpO2 99%. There is no height or weight on file to calculate BMI.  Past Psychiatric History:  Depression, Anxiety, Polysubstance Abuse (EtOH, Stimulant, Benzo, and THC), and ADHD, and no history of Suicide Attempts, Self Injurious Behavior, or Psychiatric Hospitalizations.   Is the patient at risk to self? No  Has the patient been a risk to self in the past 6  months? No .    Has the patient been a risk to self within the distant past? No   Is the patient a risk to others? No   Has the patient been a risk to others in the past 6 months? No   Has the patient been a risk to others within the distant past? No   Past Medical History: Report None Family History: Father: EtOH Abuse. No Known Diagnosis' or Suicides. Social History: Unhoused.  On probation.  Currently unemployed.  Last Labs:  Admission on 02/29/2024, Discharged on 03/01/2024  Component Date Value Ref Range Status   Glucose-Capillary 02/29/2024 91  70 - 99 mg/dL Final   Glucose reference range applies only to samples taken after fasting for at least 8 hours.   Comment 1 02/29/2024 Document in Chart   Final   WBC 03/01/2024 12.4 (H)  4.0 - 10.5 K/uL Final   RBC 03/01/2024 4.78  4.22 - 5.81 MIL/uL Final   Hemoglobin 03/01/2024 14.9  13.0 - 17.0 g/dL Final   HCT 16/07/9603 44.7  39.0 - 52.0 % Final   MCV 03/01/2024 93.5  80.0 - 100.0 fL Final   MCH 03/01/2024 31.2  26.0 - 34.0  pg Final   MCHC 03/01/2024 33.3  30.0 - 36.0 g/dL Final   RDW 54/06/8118 12.8  11.5 - 15.5 % Final   Platelets 03/01/2024 283  150 - 400 K/uL Final   nRBC 03/01/2024 0.0  0.0 - 0.2 % Final   Performed at [facility], [address] [place], [place] [address]   Acetaminophen  (Tylenol ), Serum 03/01/2024 <10 (L)  10 - 30 ug/mL Final   Comment: (NOTE) Therapeutic concentrations vary significantly. A range of 10-30 ug/mL  may be an effective concentration for many patients. However, some  are best treated at concentrations outside of this range. Acetaminophen  concentrations >150 ug/mL at 4 hours after ingestion  and >50 ug/mL at 12 hours after ingestion are often associated with  toxic reactions.  Performed at [facility], [address], [place], [place] [address]    Salicylate Lvl 03/01/2024 <7.0 (L)  7.0 - 30.0 mg/dL Final   Performed at [facility], [address] [place], [place] [address]   Alcohol, Ethyl (B) 03/01/2024 <15  <15 mg/dL Final   Comment: (NOTE) For medical purposes only. Performed at [facility], [address], [place], [place] [address]    Sodium 03/01/2024 140  135 - 145 mmol/L Final   Potassium 03/01/2024 3.7  3.5 - 5.1 mmol/L Final   Chloride 03/01/2024 104  98 - 111 mmol/L Final   CO2 03/01/2024 25  22 - 32 mmol/L Final   Glucose, Bld 03/01/2024 92  70 - 99 mg/dL Final   Glucose reference range applies only to samples taken after fasting for at least 8 hours.   BUN 03/01/2024 15  6 - 20 mg/dL Final   Creatinine, Ser 03/01/2024 0.93  0.61 - 1.24 mg/dL Final   Calcium 62/95/2841 9.5  8.9 - 10.3 mg/dL Final   Total Protein 32/44/0102 6.7  6.5 - 8.1 g/dL Final   Albumin 72/53/6644 3.7  3.5 - 5.0 g/dL Final   AST 03/47/4259 20  15 - 41 U/L Final   ALT 03/01/2024 20  0 - 44 U/L Final   Alkaline Phosphatase 03/01/2024 51  38 - 126 U/L Final   Total Bilirubin 03/01/2024 1.3 (H)  0.0 - 1.2 mg/dL Final   GFR, Estimated 03/01/2024 >  60   >60 mL/min Final   Comment: (NOTE) Calculated using the CKD-EPI Creatinine Equation (2021)    Anion gap 03/01/2024 11  5 - 15 Final   Performed at [facility], [address], [place], [place] [address]   Opiates 03/01/2024 NONE DETECTED  NONE DETECTED Final   Cocaine 03/01/2024 POSITIVE (A)  NONE DETECTED Final   Benzodiazepines 03/01/2024 POSITIVE (A)  NONE DETECTED Final   Amphetamines 03/01/2024 POSITIVE (A)  NONE DETECTED Final   Comment: (NOTE) Trazodone  is metabolized in vivo to several metabolites, including pharmacologically active m-CPP, which is excreted in the urine. Immunoassay screens for amphetamines and MDMA have potential cross-reactivity with these compounds and may provide false positive  results.     Tetrahydrocannabinol 03/01/2024 POSITIVE (A)  NONE DETECTED Final   Barbiturates 03/01/2024 NONE DETECTED  NONE DETECTED Final   Comment: (NOTE) DRUG SCREEN FOR MEDICAL PURPOSES ONLY.  IF CONFIRMATION IS NEEDED FOR ANY PURPOSE, NOTIFY LAB WITHIN 5 DAYS.  LOWEST DETECTABLE LIMITS FOR URINE DRUG SCREEN Drug Class                     Cutoff (ng/mL) Amphetamine  and metabolites    1000 Barbiturate and metabolites    200 Benzodiazepine                 200 Opiates and metabolites        300 Cocaine and metabolites        300 THC                            50 Performed at [facility], [address], [place], [place] [address]     Allergies: Patient has no known allergies.  Medications:  Facility Ordered Medications  Medication   [COMPLETED] thiamine  (VITAMIN B1) injection 100 mg   hydrOXYzine  (ATARAX ) tablet 25 mg   loperamide  (IMODIUM ) capsule 2-4 mg   LORazepam  (ATIVAN ) tablet 1 mg   multivitamin with minerals tablet 1 tablet   ondansetron  (ZOFRAN -ODT) disintegrating tablet 4 mg   thiamine  (VITAMIN B1) tablet 100 mg   amphetamine -dextroamphetamine  (ADDERALL) tablet 30 mg   [COMPLETED] clonazePAM  (KLONOPIN ) tablet 1 mg   escitalopram   (LEXAPRO ) tablet 5 mg   nicotine  (NICODERM CQ  - dosed in mg/24 hours) patch 21 mg   traZODone  (DESYREL ) tablet 50 mg   acetaminophen  (TYLENOL ) tablet 650 mg   alum & mag hydroxide-simeth (MAALOX/MYLANTA) 200-200-20 MG/5ML suspension 30 mL   magnesium  hydroxide (MILK OF MAGNESIA) suspension 30 mL   haloperidol  (HALDOL ) tablet 5 mg   And   diphenhydrAMINE  (BENADRYL ) capsule 50 mg   haloperidol  lactate (HALDOL ) injection 5 mg   And   diphenhydrAMINE  (BENADRYL ) injection 50 mg   And   LORazepam  (ATIVAN ) injection 2 mg   haloperidol  lactate (HALDOL ) injection 10 mg   And   diphenhydrAMINE  (BENADRYL ) injection 50 mg   And   LORazepam  (ATIVAN ) injection 2 mg   PTA Medications  Medication Sig   clonazePAM  (KLONOPIN ) 1 MG tablet Take 0.5-1 tablets (0.5-1 mg total) by mouth once daily.   amphetamine -dextroamphetamine  (ADDERALL) 30 MG tablet Take 1 tablet by mouth 2 (two) times daily.    Long Term Goals: Improvement in symptoms so as ready for discharge  Short Term Goals: Patient will verbalize feelings in meetings with treatment team members., Patient will participate in completing the [place] Suicide Severity Rating Scale, and Patient will score a low  risk of violence for 24 hours prior to discharge  Medical Decision Making  Fredderick Swanger is a 33 yr old male who presented to [facility] on 5/29 via EMS after being found in his car in a [employer] parking lot, he was admitted to [facility] on 5/30. PPHx is significant for Depression, Anxiety, Polysubstance Abuse (EtOH, Stimulant, Benzo, and THC), and ADHD, and no history of Suicide Attempts, Self Injurious Behavior, or Psychiatric Hospitalizations.   Booker would benefit from staying and starting treatment for depression/anxiety and substance abuse.  However, he is not currently interested in treatment and he does not currently meet criteria for IVC so we cannot hold him here.  He will need to sign out AMA.  We will provide information for walk in hours for  outpatient services.    Recommendations  Based on my evaluation the patient does not appear to have an emergency medical condition.  Basilia Bosworth, DO 03/02/24  10:55 AM

## 2024-03-02 NOTE — ED Notes (Signed)
 Patient A&O x 4, ambulatory. Patient discharged AMA. Patient denied SI/HI, A/VH upon discharge. Pt states, "I have to get to a job interview". Suicide safety plan completed and reviewed with Clinical research associate. A copy given to pt. Patient reported mood 10/10.  Pt belongings returned to patient from locker #1 intact. Patient escorted to lobby via staff for transport to destination. Safety maintained.

## 2024-03-11 ENCOUNTER — Other Ambulatory Visit (HOSPITAL_BASED_OUTPATIENT_CLINIC_OR_DEPARTMENT_OTHER): Payer: Self-pay

## 2024-05-06 ENCOUNTER — Other Ambulatory Visit (HOSPITAL_BASED_OUTPATIENT_CLINIC_OR_DEPARTMENT_OTHER): Payer: Self-pay

## 2024-05-06 MED ORDER — CLONAZEPAM 1 MG PO TABS
0.5000 mg | ORAL_TABLET | Freq: Every day | ORAL | 5 refills | Status: DC
  Filled 2024-05-06: qty 30, 30d supply, fill #0
  Filled 2024-06-11: qty 30, 30d supply, fill #1
  Filled 2024-07-10: qty 30, 30d supply, fill #2

## 2024-05-06 MED ORDER — AMPHETAMINE-DEXTROAMPHETAMINE 30 MG PO TABS
30.0000 mg | ORAL_TABLET | Freq: Two times a day (BID) | ORAL | 0 refills | Status: DC
  Filled 2024-05-06: qty 60, 30d supply, fill #0

## 2024-05-06 MED ORDER — AMPHETAMINE-DEXTROAMPHETAMINE 30 MG PO TABS
30.0000 mg | ORAL_TABLET | Freq: Two times a day (BID) | ORAL | 0 refills | Status: DC
  Filled 2024-07-10: qty 60, 30d supply, fill #0

## 2024-05-06 MED ORDER — AMPHETAMINE-DEXTROAMPHETAMINE 30 MG PO TABS
30.0000 mg | ORAL_TABLET | Freq: Two times a day (BID) | ORAL | 0 refills | Status: DC
  Filled 2024-06-11: qty 60, 30d supply, fill #0

## 2024-06-11 ENCOUNTER — Other Ambulatory Visit (HOSPITAL_BASED_OUTPATIENT_CLINIC_OR_DEPARTMENT_OTHER): Payer: Self-pay

## 2024-07-10 ENCOUNTER — Other Ambulatory Visit: Payer: Self-pay

## 2024-07-10 ENCOUNTER — Other Ambulatory Visit (HOSPITAL_BASED_OUTPATIENT_CLINIC_OR_DEPARTMENT_OTHER): Payer: Self-pay

## 2024-07-19 ENCOUNTER — Other Ambulatory Visit: Payer: Self-pay

## 2024-07-19 ENCOUNTER — Encounter (HOSPITAL_COMMUNITY): Payer: Self-pay | Admitting: *Deleted

## 2024-07-19 ENCOUNTER — Emergency Department (HOSPITAL_COMMUNITY)
Admission: EM | Admit: 2024-07-19 | Discharge: 2024-07-19 | Disposition: A | Discharge status: Home / Self Care | Payer: Self-pay | Attending: Emergency Medicine | Admitting: Emergency Medicine

## 2024-07-19 DIAGNOSIS — F1910 Other psychoactive substance abuse, uncomplicated: Secondary | ICD-10-CM

## 2024-07-19 DIAGNOSIS — Z5902 Unsheltered homelessness: Secondary | ICD-10-CM | POA: Insufficient documentation

## 2024-07-19 DIAGNOSIS — Z5900 Homelessness unspecified: Secondary | ICD-10-CM

## 2024-07-19 DIAGNOSIS — F1990 Other psychoactive substance use, unspecified, uncomplicated: Secondary | ICD-10-CM

## 2024-07-19 LAB — COMPREHENSIVE METABOLIC PANEL WITH GFR
ALT: 14 U/L (ref 0–44)
AST: 29 U/L (ref 15–41)
Albumin: 5 g/dL (ref 3.5–5.0)
Alkaline Phosphatase: 93 U/L (ref 38–126)
Anion gap: 14 (ref 5–15)
BUN: 15 mg/dL (ref 6–20)
CO2: 24 mmol/L (ref 22–32)
Calcium: 10.1 mg/dL (ref 8.9–10.3)
Chloride: 103 mmol/L (ref 98–111)
Creatinine, Ser: 0.95 mg/dL (ref 0.61–1.24)
GFR, Estimated: >60 mL/min (ref >60)
Glucose, Bld: 89 mg/dL (ref 70–99)
Potassium: 4.7 mmol/L (ref 3.5–5.1)
Sodium: 142 mmol/L (ref 135–145)
Total Bilirubin: 0.7 mg/dL (ref 0.0–1.2)
Total Protein: 8.7 g/dL — ABNORMAL HIGH (ref 6.5–8.1)

## 2024-07-19 LAB — URINALYSIS, ROUTINE W REFLEX MICROSCOPIC
Bacteria, UA: NONE SEEN
Bilirubin Urine: NEGATIVE
Glucose, UA: NEGATIVE mg/dL
Hgb urine dipstick: NEGATIVE
Ketones, ur: 20 mg/dL — AB
Leukocytes,Ua: NEGATIVE
Nitrite: NEGATIVE
Protein, ur: 30 mg/dL — AB
Specific Gravity, Urine: 1.031 — ABNORMAL HIGH (ref 1.005–1.030)
pH: 5 (ref 5.0–8.0)

## 2024-07-19 LAB — CBC
HCT: 47.7 % (ref 39.0–52.0)
Hemoglobin: 15.3 g/dL (ref 13.0–17.0)
MCH: 31 pg (ref 26.0–34.0)
MCHC: 32.1 g/dL (ref 30.0–36.0)
MCV: 96.8 fL (ref 80.0–100.0)
Platelets: 286 K/uL (ref 150–400)
RBC: 4.93 MIL/uL (ref 4.22–5.81)
RDW: 12.7 % (ref 11.5–15.5)
WBC: 8.9 K/uL (ref 4.0–10.5)
nRBC: 0 % (ref 0.0–0.2)

## 2024-07-19 LAB — URINE DRUG SCREEN
Amphetamines: POSITIVE — AB
Barbiturates: NEGATIVE
Benzodiazepines: POSITIVE — AB
Cocaine: POSITIVE — AB
Fentanyl: NEGATIVE
Methadone Scn, Ur: NEGATIVE
Opiates: NEGATIVE
Tetrahydrocannabinol: POSITIVE — AB

## 2024-07-19 LAB — ETHANOL: Alcohol, Ethyl (B): 51 mg/dL — ABNORMAL HIGH (ref <15)

## 2024-07-19 NOTE — ED Triage Notes (Signed)
 BIB [facility] [place]/ (homeless). Endorses recent adderall, klonopoin ETOH and crack use. No recollection from last night. Denies recent illness, NVD, fever, fall, injury, cough, sob or other sx. VSS for EMS. CBG 76. Alert, NAD, calm, interactive, appropriate. Steady gait. Requesting BH for detox for time away from outside and drugs. Denies SI/ HI or AVH.

## 2024-07-19 NOTE — Consult Note (Signed)
 [facility] Psychiatric Consult Initial  Patient Name: .William Allison  MRN: [id number]  DOB: 1991/07/14  Consult Order details:  Orders (From admission, onward)     Start     Ordered   07/19/24 1207  CONSULT TO CALL ACT TEAM       Ordering Provider: Bernis Ernst, PA-C  Provider:  (Not yet assigned)  Question:  Reason for Consult?  Answer:  substance use   07/19/24 1206             Mode of Visit: In person    Psychiatry Consult Evaluation  Service Date: July 19, 2024 LOS:  LOS: 0 days  Chief Complaint I need a couple days to detox   Primary Psychiatric Diagnoses  Polysubstance Abuse  Assessment  William Allison is a 33 y.o. male admitted: Presented to the EDfor 07/19/2024  8:14 AM for brought in by EMS [place] baseball field. He carries the psychiatric diagnoses of cocaine abuse, alcohol abuse, methamphetamine abuse, benzodiazepine abuse, ADHD, GAD and MDD and has a past medical history of GERD.   His current presentation of calm contemplation of his substance abuse treatment options is most consistent with active polysubstance abuse. He meets criteria for inpatient treatment based on his active addictions, but he has decided he needs to find his cell phone before going inpatient for detox.  He does not meet IVC criteria.  Current outpatient psychotropic medications include adderall and historically he has had a desired response to these medications. He was compliant with medications prior to admission as evidenced by patient report. On initial examination, patient is calm and requesting a couple days to detox. Please see plan below for detailed recommendations.   Diagnoses:  Active Hospital problems: Active Problems:   Polysubstance abuse (HCC)    Plan   ## Psychiatric Medication Recommendations:  none  ## Medical Decision Making Capacity: Not specifically addressed in this encounter  ## Further Work-up:  -- most recent EKG on 07/19/2024 had QtC of 437 -- Pertinent  labwork reviewed earlier this admission includes: CBC, CMP, alcohol and UDS   ## Disposition:-- There are no psychiatric contraindications to discharge at this time  ## Behavioral / Environmental: - No specific recommendations at this time.     ## Safety and Observation Level:  - Based on my clinical evaluation, I estimate the patient to be at low risk of self harm in the current setting. - At this time, we recommend  routine. This decision is based on my review of the chart including patient's history and current presentation, interview of the patient, mental status examination, and consideration of suicide risk including evaluating suicidal ideation, plan, intent, suicidal or self-harm behaviors, risk factors, and protective factors. This judgment is based on our ability to directly address suicide risk, implement suicide prevention strategies, and develop a safety plan while the patient is in the clinical setting. Please contact our team if there is a concern that risk level has changed.  CSSR Risk Category:C-SSRS RISK CATEGORY: No Risk  Suicide Risk Assessment: Patient has following modifiable risk factors for suicide: addictions, which we are addressing by recommending psychiatric follow up. Patient has following non-modifiable or demographic risk factors for suicide: male gender Patient has the following protective factors against suicide: Access to outpatient mental health care  Thank you for this consult request. Recommendations have been communicated to the primary team.  We will sign off at this time.   Luverta Korte A Kline, NP  History of Present Illness  Relevant Aspects of Hospital ED Course:  Admitted on 07/19/2024 for brought in by EMS from a baseball field. He carries the psychiatric diagnoses of cocaine abuse, alcohol abuse, methamphetamine abuse, benzodiazepine abuse, ADHD, GAD and MDD and has a past medical history of GERD.   Patient Report:  William Allison, is seen face to  face by this provider, consulted with Dr. Merilee; and chart reviewed on 07/19/24.  On evaluation William Allison reports he was interested in a couple days to detox.  As the assessment continues, patient voices that he has changed his mind and needs to find his phone and wallet before going inpatient to detox and requests to leave.  Patient is provided resources for outpatient services.    Discussed the benefits of long term substance abuse treatment.  Discussed the benefits of AA and NA.  Discussed the medication options that can help support recovery efforts and reduce cravings such as naltrexone and wellbutrin.  Patient says he will consider the options.   During evaluation William Allison is sitting in a chair in no acute distress.  He is alert & oriented x 4, calm, cooperative and attentive for this assessment.  His mood is anxious with congruent affect.  He has normal speech, and behavior.  Objectively there is no evidence of psychosis/mania or delusional thinking. Pt does not appear to be responding to internal or external stimuli.  Patient is able to converse coherently, goal directed thoughts, no distractibility, or pre-occupation.  He also denies suicidal/self-harm/homicidal ideation, psychosis, and paranoia.  Patient answered questions appropriately.    I personally spent a total of 60 minutes in the care of the patient today including preparing to see the patient, getting/reviewing separately obtained history, performing a medically appropriate exam/evaluation, counseling and educating, placing orders, referring and communicating with other health care professionals, documenting clinical information in the EHR, independently interpreting results, and communicating results.  Psych ROS:  Depression: endorses Anxiety:  endorses Mania (lifetime and current): denies Psychosis: (lifetime and current): denies  Review of Systems  Psychiatric/Behavioral:  Positive for substance abuse.   All other  systems reviewed and are negative.    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 Living Situation: homeless Spiritual Hx: none Access to weapons/lethal means: denies   Substance History Alcohol: endorses  Last Drink: yesterday Number of drinks per day I don't know History of alcohol withdrawal seizures: denies History of DT's: denies Tobacco: endorses Illicit drugs: endorses - cocaine, THC, methamphetamine, benzodiazepine Rehab hx: detox only  Exam Findings  Physical Exam:  Vital Signs:  Temp:  [97.5 F (36.4 C)-97.9 F (36.6 C)] 97.9 F (36.6 C) (10/17 1250) Pulse Rate:  [70-84] 70 (10/17 1250) Resp:  [16-17] 17 (10/17 1250) BP: (120-126)/(85-95) 120/85 (10/17 1250) SpO2:  [98 %-100 %] 99 % (10/17 1250) Weight:  [90.7 kg] 90.7 kg (10/17 0825) Blood pressure 120/85, pulse 70, temperature 97.9 F (36.6 C), temperature source Oral, resp. rate 17, weight 90.7 kg, SpO2 99%. Body mass index is  24.34 kg/m.  Physical Exam Vitals and nursing note reviewed.  Eyes:     Pupils: Pupils are equal, round, and reactive to light.  Pulmonary:     Effort: Pulmonary effort is normal.  Skin:    General: Skin is dry.  Neurological:     Mental Status: He is alert and oriented to person, place, and time.  Psychiatric:        Attention and Perception: Attention and perception normal.        Mood and Affect: Mood is anxious.        Speech:  Speech normal.        Behavior: Behavior normal. Behavior is cooperative.        Thought Content: Thought content normal.        Cognition and Memory: Cognition and memory normal.        Judgment: Judgment is impulsive.     Mental Status Exam: General Appearance: Casual  Orientation:  Full (Time, Place, and Person)  Memory:  Immediate;   Good Recent;   Good Remote;   Good  Concentration:  Concentration: Good  Recall:  Good  Attention  Good  Eye Contact:  Good  Speech:  Clear and Coherent  Language:  Good  Volume:  Normal  Mood: anxious  Affect:  Congruent  Thought Process:  Coherent  Thought Content:  Logical  Suicidal Thoughts:  No  Homicidal Thoughts:  No  Judgement:  Impaired  Insight:  Lacking  Psychomotor Activity:  Normal  Akathisia:  No  Fund of Knowledge:  Good      Assets:  Communication Skills Leisure Time Physical Health  Cognition:  WNL  ADL's:  Intact  AIMS (if indicated):        Other History   These have been pulled in through the EMR, reviewed, and updated if appropriate.  Family History:  The patient's family history is not on file.  Medical History: Past Medical History:  Diagnosis Date   ADD (attention deficit disorder)    Constipation 05/30/12   [facility], [facility] Just; Donnatal q.i.d. Follow up in 4 weeks.   GERD (gastroesophageal reflux disease)     Surgical History: Past Surgical History:  Procedure Laterality Date   HAND SURGERY     for fracture   LAPAROSCOPIC APPENDECTOMY N/A 12/26/2016   Procedure: APPENDECTOMY LAPAROSCOPIC;  Surgeon: Morene Olives, MD;  Location: [facility];  Service: General;  Laterality: N/A;   TYMPANOSTOMY TUBE PLACEMENT       Medications:  No current facility-administered medications for this encounter.  Current Outpatient Medications:    amphetamine -dextroamphetamine  (ADDERALL) 30 MG tablet, Take 1 tablet by mouth 2 (two) times daily., Disp: 60 tablet, Rfl: 0    amphetamine -dextroamphetamine  (ADDERALL) 30 MG tablet, Take 1 tablet by mouth 2 (two) times daily., Disp: 60 tablet, Rfl: 0   amphetamine -dextroamphetamine  (ADDERALL) 30 MG tablet, Take 1 tablet by mouth 2 (two) times daily., Disp: 60 tablet, Rfl: 0   clonazePAM  (KLONOPIN ) 1 MG tablet, Take 0.5-1 tablets (0.5-1 mg total) by mouth daily., Disp: 30 tablet, Rfl: 5  Allergies: No Known Allergies  Kaidyn Javid A Kline, NP

## 2024-07-19 NOTE — Discharge Instructions (Addendum)
 You were seen in the ER today for evaluation for detox. Please follow up with the [facility] for continued care. If you have any concerns, new or worsening symptoms, please return to the ER today for re-evaluation.   Contact a health care provider if: You use illegal drugs and you want help to change your addictive behavior. You lose interest in things you used to enjoy, like hobbies or family activities. Your eating or sleeping habits change as a result of drug use. You use medicine to get the same effects as a drug. This is illegal use and can become addictive as well. You stopped illegal drug use previously and you start actively using it again (relapse). Get help right away if: You have thoughts about hurting yourself or others. If you ever feel like you may hurt yourself or others, or have thoughts about taking your own life, get help right away. Go to your nearest emergency department or: Call your local emergency services (911 in the [place] Call a suicide crisis helpline, such as the National Suicide Prevention Lifeline at 979-383-1158 or 988 in the [place] This is open 24 hours a day in the [place] If you're a Veteran: Call 988 and press 1. This is open 24 hours a day. Text the [employer] at [phone].

## 2024-07-19 NOTE — ED Notes (Signed)
 Patient still stated he doesn't have to urinate

## 2024-07-19 NOTE — ED Provider Notes (Signed)
 [place] EMERGENCY DEPARTMENT AT [facility] Provider Note   CSN: [id number] Arrival date & time: 07/19/24  9186     Patient presents with: Medical Clearance   William Allison is a 33 y.o. male with h/o polysubstance use disorder presents to the emergency department today for evaluation of requesting detox. The patient reports that he was drinking alcohol and smoking crack this morning. He reports that when he came to this morning, he was standing on someone's porch and was cold and was momentarily confused. He then walked to a park and spoke with a park employee and asked to call 911. No trouble walking or talking.  He reports that he is prescribed Adderall and Klonopin .  He reports that he had 2 beers last night with some friends when he smoked crack as well.  Denies any IV drug use ever.  He denies any hallucinations, suicidal ideations, or homicidal ideations.  He reports that he has had a seizure from alcohol over a year ago however patient reports that he remembers having the seizure and reports that he was shaking all over his body.  Does not sound consistent with seizure.  No nausea or vomiting.  No fevers or headaches.  Currently unhoused.  HPI     Prior to Admission medications   Medication Sig Start Date End Date Taking? Authorizing Provider  amphetamine -dextroamphetamine  (ADDERALL) 30 MG tablet Take 1 tablet by mouth 2 (two) times daily. 06/04/24     amphetamine -dextroamphetamine  (ADDERALL) 30 MG tablet Take 1 tablet by mouth 2 (two) times daily. 05/06/24     amphetamine -dextroamphetamine  (ADDERALL) 30 MG tablet Take 1 tablet by mouth 2 (two) times daily. 07/03/24     clonazePAM  (KLONOPIN ) 1 MG tablet Take 0.5-1 tablets (0.5-1 mg total) by mouth daily. 05/06/24       Allergies: Patient has no known allergies.    Review of Systems  Constitutional:  Negative for chills and fever.  Respiratory:  Negative for shortness of breath.   Cardiovascular:  Negative for chest pain.   Neurological:  Negative for light-headedness and headaches.  Psychiatric/Behavioral:  Negative for hallucinations and suicidal ideas.     Updated Vital Signs BP 118/82 (BP Location: Left Arm)   Pulse 67   Temp 98 F (36.7 C) (Oral)   Resp 16   Wt 90.7 kg   SpO2 98%   BMI 24.34 kg/m   Physical Exam Vitals and nursing note reviewed.  Constitutional:      General: He is not in acute distress.    Appearance: He is not ill-appearing or toxic-appearing.     Comments: Sitting up in chair, watching television in no acute distress  HENT:     Mouth/Throat:     Mouth: Mucous membranes are moist.  Eyes:     General: No scleral icterus. Pulmonary:     Effort: Pulmonary effort is normal. No respiratory distress.  Neurological:     Mental Status: He is alert and oriented to person, place, and time.     GCS: GCS eye subscore is 4. GCS verbal subscore is 5. GCS motor subscore is 6.     Cranial Nerves: No facial asymmetry.     Gait: Gait normal.  Psychiatric:        Thought Content: Thought content does not include homicidal or suicidal ideation. Thought content does not include homicidal or suicidal plan.     (all labs ordered are listed, but only abnormal results are displayed) Labs Reviewed  COMPREHENSIVE METABOLIC  PANEL WITH GFR - Abnormal; Notable for the following components:      Result Value   Total Protein 8.7 (*)    All other components within normal limits  ETHANOL - Abnormal; Notable for the following components:   Alcohol, Ethyl (B) 51 (*)    All other components within normal limits  URINE DRUG SCREEN - Abnormal; Notable for the following components:   Cocaine POSITIVE (*)    Benzodiazepines POSITIVE (*)    Amphetamines POSITIVE (*)    Tetrahydrocannabinol POSITIVE (*)    All other components within normal limits  URINALYSIS, ROUTINE W REFLEX MICROSCOPIC - Abnormal; Notable for the following components:   Color, Urine AMBER (*)    APPearance HAZY (*)    Specific  Gravity, Urine 1.031 (*)    Ketones, ur 20 (*)    Protein, ur 30 (*)    All other components within normal limits  CBC    EKG: None  Radiology: No results found.  Procedures   Medications Ordered in the ED - No data to display  Medical Decision Making Amount and/or Complexity of Data Reviewed Labs: ordered.   33 y.o. male presents to the ER for evaluation of seeking detox. Differential diagnosis includes but is not limited to electrolyte abnormality, dehydration, seizure, illicit substance use. Vital signs blood pressure 126/95, temperature 97.5, otherwise unremarkable. Physical exam as noted above.   On previous chart evaluation, patient's been seen multiple times for a polysubstance use disorder.  No overdose for this visit.  Seeking detox.  I do not see any history of reported seizures.  He does not appear tremulous.  He is in no acute distress sitting in chair watching television.  Not ill-appearing.  Patient reports of some momentary confusion after consuming illicit substances and alcohol last night.  He has no focal deficit.  Answering questions appropriate appropriate speech.  Again, not tremulous.  Does not appear to be in any acute withdrawal.  He is not having any nausea or vomiting.  Will get lab work.  I independently reviewed and interpreted the patient's labs.  CMP shows total protein 8.7, otherwise no electrolyte or LFT abnormality.  CBC without leukocytosis or anemia.  Urinalysis shows amber, hazy urine is concentrated.  20 ketones of 30 protein otherwise unremarkable.  Ethanol at 51.  UDS shows positive for cocaine, benzos, amphetamines, and THC.  Patient's not having any headaches or trouble with walking or talking, do not think any CT imaging is needed at this time.  No managements, do not feel this is meningitis.  He is alert and oriented x 4 here.  Do feel like this is likely more substance related.  Will have TTS consult.  TTS to sign up on the patient.  Again, he  does not appear to be any acute withdrawals.  Nontremulous.  Ambulatory.  Vital signs are stable.  Oriented to person place time and situation. He denies any SI/HI. I have given the patient for rehab for him to follow-up with.  He is medically clear for discharge and follow up with [facility].  We discussed the results of the labs/imaging. The plan is follow-up with [facility]. We discussed strict return precautions and red flag symptoms. The patient verbalized their understanding and agrees to the plan. The patient is stable and being discharged home in good condition.  Portions of this report may have been transcribed using voice recognition software. Every effort was made to ensure accuracy; however, inadvertent computerized transcription errors may be present.  Final diagnoses:  Polysubstance use disorder  Homelessness    ED Discharge Orders     None          Bernis Ernst, [place] 07/19/24 1547    Rogelia Jerilynn RAMAN, MD 07/19/24 [phone]

## 2024-07-19 NOTE — ED Notes (Signed)
 Patient states he can't produce urine yet

## 2024-07-21 ENCOUNTER — Encounter (HOSPITAL_COMMUNITY): Payer: Self-pay | Admitting: Behavioral Health

## 2024-07-21 ENCOUNTER — Ambulatory Visit (HOSPITAL_COMMUNITY)
Admission: EM | Admit: 2024-07-21 | Discharge: 2024-07-21 | Disposition: A | Discharge status: Other Acute Inpatient Hospital | Attending: Psychiatry | Admitting: Psychiatry

## 2024-07-21 ENCOUNTER — Inpatient Hospital Stay (HOSPITAL_COMMUNITY)
Admission: AD | Admit: 2024-07-21 | Discharge: 2024-07-26 | DRG: 885 | Disposition: A | Discharge status: Home / Self Care

## 2024-07-21 ENCOUNTER — Other Ambulatory Visit: Payer: Self-pay

## 2024-07-21 DIAGNOSIS — Z5941 Food insecurity: Secondary | ICD-10-CM | POA: Diagnosis not present

## 2024-07-21 DIAGNOSIS — F319 Bipolar disorder, unspecified: Secondary | ICD-10-CM | POA: Insufficient documentation

## 2024-07-21 DIAGNOSIS — F1220 Cannabis dependence, uncomplicated: Secondary | ICD-10-CM | POA: Diagnosis present

## 2024-07-21 DIAGNOSIS — Z5900 Homelessness unspecified: Secondary | ICD-10-CM | POA: Insufficient documentation

## 2024-07-21 DIAGNOSIS — Z818 Family history of other mental and behavioral disorders: Secondary | ICD-10-CM | POA: Diagnosis not present

## 2024-07-21 DIAGNOSIS — F419 Anxiety disorder, unspecified: Secondary | ICD-10-CM | POA: Diagnosis not present

## 2024-07-21 DIAGNOSIS — F411 Generalized anxiety disorder: Secondary | ICD-10-CM | POA: Diagnosis present

## 2024-07-21 DIAGNOSIS — Z653 Problems related to other legal circumstances: Secondary | ICD-10-CM

## 2024-07-21 DIAGNOSIS — F1410 Cocaine abuse, uncomplicated: Secondary | ICD-10-CM | POA: Diagnosis present

## 2024-07-21 DIAGNOSIS — Z5971 Insufficient health insurance coverage: Secondary | ICD-10-CM | POA: Diagnosis not present

## 2024-07-21 DIAGNOSIS — F909 Attention-deficit hyperactivity disorder, unspecified type: Secondary | ICD-10-CM | POA: Diagnosis present

## 2024-07-21 DIAGNOSIS — Z560 Unemployment, unspecified: Secondary | ICD-10-CM

## 2024-07-21 DIAGNOSIS — Z5982 Transportation insecurity: Secondary | ICD-10-CM

## 2024-07-21 DIAGNOSIS — F1910 Other psychoactive substance abuse, uncomplicated: Secondary | ICD-10-CM

## 2024-07-21 DIAGNOSIS — K5900 Constipation, unspecified: Secondary | ICD-10-CM | POA: Diagnosis present

## 2024-07-21 DIAGNOSIS — F1024 Alcohol dependence with alcohol-induced mood disorder: Secondary | ICD-10-CM | POA: Diagnosis present

## 2024-07-21 DIAGNOSIS — F17210 Nicotine dependence, cigarettes, uncomplicated: Secondary | ICD-10-CM | POA: Insufficient documentation

## 2024-07-21 DIAGNOSIS — F10229 Alcohol dependence with intoxication, unspecified: Secondary | ICD-10-CM

## 2024-07-21 DIAGNOSIS — K219 Gastro-esophageal reflux disease without esophagitis: Secondary | ICD-10-CM | POA: Diagnosis present

## 2024-07-21 DIAGNOSIS — F988 Other specified behavioral and emotional disorders with onset usually occurring in childhood and adolescence: Secondary | ICD-10-CM | POA: Diagnosis present

## 2024-07-21 DIAGNOSIS — Z811 Family history of alcohol abuse and dependence: Secondary | ICD-10-CM | POA: Insufficient documentation

## 2024-07-21 DIAGNOSIS — F1510 Other stimulant abuse, uncomplicated: Secondary | ICD-10-CM | POA: Diagnosis present

## 2024-07-21 LAB — ETHANOL: Alcohol, Ethyl (B): 72 mg/dL — ABNORMAL HIGH (ref <15)

## 2024-07-21 MED ORDER — ADULT MULTIVITAMIN W/MINERALS CH
1.0000 | ORAL_TABLET | Freq: Every day | ORAL | Status: DC
  Administered 2024-07-21: 1 via ORAL
  Filled 2024-07-21: qty 1

## 2024-07-21 MED ORDER — LORAZEPAM 2 MG/ML IJ SOLN: 2.0000 mg | Freq: Three times a day (TID) | INTRAMUSCULAR | Status: DC | PRN

## 2024-07-21 MED ORDER — CHLORDIAZEPOXIDE HCL 25 MG PO CAPS
25.0000 mg | ORAL_CAPSULE | Freq: Three times a day (TID) | ORAL | Status: AC
  Administered 2024-07-22 – 2024-07-23 (×3): 25 mg via ORAL
  Filled 2024-07-21 (×3): qty 1

## 2024-07-21 MED ORDER — DIPHENHYDRAMINE HCL 50 MG/ML IJ SOLN: 50.0000 mg | Freq: Three times a day (TID) | INTRAMUSCULAR | Status: DC | PRN

## 2024-07-21 MED ORDER — ONDANSETRON 4 MG PO TBDP: 4.0000 mg | ORAL_TABLET | Freq: Four times a day (QID) | ORAL | Status: DC | PRN

## 2024-07-21 MED ORDER — DIPHENHYDRAMINE HCL 50 MG PO CAPS: 50.0000 mg | ORAL_CAPSULE | Freq: Three times a day (TID) | ORAL | Status: DC | PRN

## 2024-07-21 MED ORDER — DIPHENHYDRAMINE HCL 25 MG PO CAPS: 50.0000 mg | ORAL_CAPSULE | Freq: Three times a day (TID) | ORAL | Status: DC | PRN

## 2024-07-21 MED ORDER — VITAMIN B-1 100 MG PO TABS
100.0000 mg | ORAL_TABLET | Freq: Every day | ORAL | Status: DC
  Administered 2024-07-22 – 2024-07-26 (×5): 100 mg via ORAL
  Filled 2024-07-21 (×4): qty 1
  Filled 2024-07-21: qty 10
  Filled 2024-07-21: qty 1

## 2024-07-21 MED ORDER — ACETAMINOPHEN 325 MG PO TABS: 650.0000 mg | ORAL_TABLET | Freq: Four times a day (QID) | ORAL | Status: DC | PRN

## 2024-07-21 MED ORDER — ONDANSETRON 4 MG PO TBDP: 4.0000 mg | ORAL_TABLET | Freq: Four times a day (QID) | ORAL | Status: AC | PRN

## 2024-07-21 MED ORDER — ALUM & MAG HYDROXIDE-SIMETH 200-200-20 MG/5ML PO SUSP: 30.0000 mL | ORAL | Status: DC | PRN

## 2024-07-21 MED ORDER — ACETAMINOPHEN 325 MG PO TABS
650.0000 mg | ORAL_TABLET | Freq: Four times a day (QID) | ORAL | Status: DC | PRN
  Administered 2024-07-21: 650 mg via ORAL
  Filled 2024-07-21: qty 2

## 2024-07-21 MED ORDER — HALOPERIDOL LACTATE 5 MG/ML IJ SOLN: 5.0000 mg | Freq: Three times a day (TID) | INTRAMUSCULAR | Status: DC | PRN

## 2024-07-21 MED ORDER — CHLORDIAZEPOXIDE HCL 25 MG PO CAPS: 25.0000 mg | ORAL_CAPSULE | Freq: Four times a day (QID) | ORAL | Status: DC | PRN

## 2024-07-21 MED ORDER — TRAZODONE HCL 50 MG PO TABS: 50.0000 mg | ORAL_TABLET | Freq: Every evening | ORAL | Status: DC | PRN

## 2024-07-21 MED ORDER — TRAZODONE HCL 50 MG PO TABS
50.0000 mg | ORAL_TABLET | Freq: Every evening | ORAL | Status: DC | PRN
  Administered 2024-07-21 – 2024-07-25 (×5): 50 mg via ORAL
  Filled 2024-07-21: qty 1
  Filled 2024-07-21: qty 10
  Filled 2024-07-21 (×4): qty 1

## 2024-07-21 MED ORDER — HALOPERIDOL 5 MG PO TABS: 5.0000 mg | ORAL_TABLET | Freq: Three times a day (TID) | ORAL | Status: DC | PRN

## 2024-07-21 MED ORDER — LOPERAMIDE HCL 2 MG PO CAPS: 2.0000 mg | ORAL_CAPSULE | ORAL | Status: DC | PRN

## 2024-07-21 MED ORDER — MAGNESIUM HYDROXIDE 400 MG/5ML PO SUSP: 30.0000 mL | Freq: Every day | ORAL | Status: DC | PRN

## 2024-07-21 MED ORDER — CHLORDIAZEPOXIDE HCL 25 MG PO CAPS
25.0000 mg | ORAL_CAPSULE | Freq: Four times a day (QID) | ORAL | Status: AC
  Administered 2024-07-21 – 2024-07-22 (×4): 25 mg via ORAL
  Filled 2024-07-21 (×4): qty 1

## 2024-07-21 MED ORDER — NICOTINE POLACRILEX 2 MG MT GUM
2.0000 mg | CHEWING_GUM | OROMUCOSAL | Status: DC | PRN
  Administered 2024-07-21 – 2024-07-26 (×24): 2 mg via ORAL
  Filled 2024-07-21 (×5): qty 1

## 2024-07-21 MED ORDER — HYDROXYZINE HCL 25 MG PO TABS
25.0000 mg | ORAL_TABLET | Freq: Three times a day (TID) | ORAL | Status: DC | PRN
  Administered 2024-07-21 – 2024-07-25 (×5): 25 mg via ORAL
  Filled 2024-07-21 (×3): qty 1
  Filled 2024-07-21: qty 10
  Filled 2024-07-21 (×2): qty 1

## 2024-07-21 MED ORDER — HALOPERIDOL LACTATE 5 MG/ML IJ SOLN: 10.0000 mg | Freq: Three times a day (TID) | INTRAMUSCULAR | Status: DC | PRN

## 2024-07-21 MED ORDER — CHLORDIAZEPOXIDE HCL 25 MG PO CAPS
25.0000 mg | ORAL_CAPSULE | ORAL | Status: AC
  Administered 2024-07-23 – 2024-07-24 (×2): 25 mg via ORAL
  Filled 2024-07-21 (×2): qty 1

## 2024-07-21 MED ORDER — LOPERAMIDE HCL 2 MG PO CAPS: 2.0000 mg | ORAL_CAPSULE | ORAL | Status: AC | PRN

## 2024-07-21 MED ORDER — CHLORDIAZEPOXIDE HCL 25 MG PO CAPS: 25.0000 mg | ORAL_CAPSULE | Freq: Four times a day (QID) | ORAL | Status: AC | PRN

## 2024-07-21 MED ORDER — ADULT MULTIVITAMIN W/MINERALS CH
1.0000 | ORAL_TABLET | Freq: Every day | ORAL | Status: DC
  Administered 2024-07-22 – 2024-07-26 (×5): 1 via ORAL
  Filled 2024-07-21 (×3): qty 1
  Filled 2024-07-21: qty 10
  Filled 2024-07-21 (×2): qty 1

## 2024-07-21 MED ORDER — HYDROXYZINE HCL 25 MG PO TABS: 25.0000 mg | ORAL_TABLET | Freq: Four times a day (QID) | ORAL | Status: DC | PRN

## 2024-07-21 MED ORDER — THIAMINE HCL 100 MG/ML IJ SOLN: 100.0000 mg | Freq: Once | INTRAMUSCULAR | Status: DC

## 2024-07-21 MED ORDER — CHLORDIAZEPOXIDE HCL 25 MG PO CAPS
25.0000 mg | ORAL_CAPSULE | Freq: Every day | ORAL | Status: AC
  Administered 2024-07-25: 25 mg via ORAL
  Filled 2024-07-21: qty 1

## 2024-07-21 NOTE — ED Notes (Addendum)
 Patient transferred to [facility] per provider order. Patient discharged in no acute distress, A& O x4 and ambulatory. Patient denied SI/HI, A/VH upon discharge. Patient verbalized understanding of transfer for continuation of care. Patient mood good. Patient belongings returned to patient from locker #26 complete and intact. Patient escorted to back [place] via staff for transport to destination. Safety maintained.

## 2024-07-21 NOTE — BHH Counselor (Signed)
 Adult Comprehensive Assessment  Patient ID: MAICOL BOWLAND, male   DOB: 1991/03/24, 33 y.o.   MRN: [id number]  Information Source: Information source: Patient  Current Stressors:  Patient states their primary concerns and needs for treatment are:: The patient stated to get sober. Patient states their goals for this hospitilization and ongoing recovery are:: The patient stated to get sober. Educational / Learning stressors: None reported Employment / Job issues: holding a job down Family Relationships: None reported Surveyor, quantity / Lack of resources (include bankruptcy): None reported Housing / Lack of housing:  has been homeless for about 1 year. Physical health (include injuries & life threatening diseases): None reported Social relationships: None reported Substance abuse: Crack cocaine and alcohol Bereavement / Loss: None reported  Living/Environment/Situation:  Living Arrangements: Other (Comment) (Homeless) How long has patient lived in current situation?: The patient stated he has been homeless for about a year.  Family History:  Marital status: Single Does patient have children?: Yes How many children?: 1 How is patient's relationship with their children?: see every once in a while but have a good relationship  Childhood History:  By whom was/is the patient raised?: Mother, Mother/father and step-parent Description of patient's relationship with caregiver when they were a child:  pretty good Patient's description of current relationship with people who raised him/her: good How were you disciplined when you got in trouble as a child/adolescent?: grounded and smoe spakings Does patient have siblings?: Yes Number of Siblings: 3 Description of patient's current relationship with siblings: 2 half brothers he has not met and sister that he has not talked to in 3 years. Did patient suffer any verbal/emotional/physical/sexual abuse as a child?: No Did patient suffer from  severe childhood neglect?: No Has patient ever been sexually abused/assaulted/raped as an adolescent or adult?: No Was the patient ever a victim of a crime or a disaster?: No Witnessed domestic violence?: No Has patient been affected by domestic violence as an adult?: No  Education:  Highest grade of school patient has completed: High school Currently a student?: No Learning disability?: No  Employment/Work Situation:   Employment Situation: Unemployed Patient's Job has Been Impacted by Current Illness: No What is the Longest Time Patient has Held a Job?: 10 years Where was the Patient Employed at that Time?: [employer] Has Patient ever Been in the [employer]?: No  Financial Resources:   Financial resources: No income Does patient have a Lawyer or guardian?: No  Alcohol/Substance Abuse:   What has been your use of drugs/alcohol within the last 12 months?: crack and alcohol If attempted suicide, did drugs/alcohol play a role in this?: No Alcohol/Substance Abuse Treatment Hx: Past Tx, Inpatient If yes, describe treatment: didnt really help much but it was okay Has alcohol/substance abuse ever caused legal problems?: Yes  Social Support System:   Patient's Community Support System: Fair Museum/gallery exhibitions officer System: its alright Type of faith/religion: No  Leisure/Recreation:   Do You Have Hobbies?: Yes Leisure and Hobbies: [employer], play video games, amd play pool  Strengths/Needs:   Patient states these barriers may affect/interfere with their treatment: None reported Patient states these barriers may affect their return to the community: None reported Other important information patient would like considered in planning for their treatment: None reported  Discharge Plan:   Currently receiving community mental health services: No Patient states concerns and preferences for aftercare planning are: None reported Patient states they will know when  they are safe and ready for discharge when: once  mind is clear and has detoxed Does patient have access to transportation?: No Does patient have financial barriers related to discharge medications?: No Patient description of barriers related to discharge medications: None reported Plan for no access to transportation at discharge:  will need assistance Plan for living situation after discharge: The patient is homeless Will patient be returning to same living situation after discharge?: No  Summary/Recommendations:   The patient is a 33 year old male from [facility], who presented to the emergency department for evaluation of requesting detox. The patient reports drinking alcohol and smoking crack the morning of. The patient denies SI/HI and AVH. The patient carries the psychiatric diagnoses of cocaine abuse, alcohol abuse, methamphetamine abuse, benzodiazepine abuse, ADHD, GAD and MDD and has a past medical history of GERD. The patient reports being homeless for about a year. The patient stated that he is unemployed, with stress of not being able to hold a job. The patient reports not having any insurance or source of income. The patient stated he has had inpatient treatment in the past but it was unsuccessful. That patient reports willingness to receive treatment. The patient stated he needed to detox. Recommendations include: crisis stabilization, therapeutic milieu, encourage group attendance and participation, medication management for mood stabilization and development of a comprehensive mental wellness/sobriety plan.    Roselyn GORMAN Lento. 07/21/2024

## 2024-07-21 NOTE — ED Provider Notes (Addendum)
 Behavioral Health Urgent Care Medical Screening Exam  Patient Name: William Allison MRN: [id number] Date of Evaluation: 07/21/24 Chief Complaint:  substance abuse, depression and anxiety Diagnosis:  Final diagnoses:  Polysubstance abuse (HCC)    Written by Randall Bouquet, NP:  History of Present illness: William Allison is a 33 y.o. male.  Presents with a hx of  polysubstance use disorder, requesting  medical detox and rehab services. Has a hx of ADHD, Bipolar and depression.  He reports that he was drinking alcohol and smoking crack on daily basis. SABRAHe reports that he went to emergency services yesterday. Per ED note, patient did not present any acute symptoms and was recommended to follow up at [facility].  Patient was discharged and resumed using substances including alcohol and crack/cocaine. Last use was a few hours ago. Reports that he drinks alcohol non-stop, up to a 24 pack of beer,  sometimes more. He also binges on crack/cocaine.  Patient reports he is homeless and recently lost his job;  was sitting on the street using  substances, became overwhelmed and called for help. He reports that he wants to continue with rehab services to maintain sobriety. He was served here before but has never been to rehab. Denies SI/HI/AVH. Denies hx of abuse. Patient reports a family hx of substance use and mental health: father abuses alcohol. Mother has Bipolar and depression. Chart review indicate hx of alcohol-related seizures: patient denies recent activities.   Face-to-face evaluation by this provider. Chart reviewed on 07/21/2024. Patient is cooperative and pleasant upon approach. Appears disheveled  with poor hygiene/body odor. He appears healthy and well-nourished. Does not appear to be preoccupied or responding to internal stimuli. Alert and oriented x 4. He maintains eye contact throughout this encounter. He denies SI/HI/AVH. Speech is clear and thought process is coherent/goal-directed.  Patient  reports that I am just tired doing drugs, I am not getting anywhere. States he started using at age 80. States he has been losing employments due to substance use. Lost his apartment due to drugs and  currently homeless. Patient also smokes cigarettes, about a pack/day. Reports he has had detox services here but was never motivated for rehab.   Patient reports hx of depression, anxiety and ADHD and wanting to get back on medications. No current outpatient services.  He denies medical/health issues.  Denies respiratory distress. Denies muscle/joint pain. Denies chest/back/abdominal pain.  States his current medications are Klonopin  and Adderall. BAL 51 on 10/17. Current results pending.   Drusilla Pizza, NP) Patient meets criteria for inpatient psychiatric treatment to address his substance use problem as well as depression and anxiety.     Flowsheet Row ED from 07/21/2024 in [facility] ED from 07/19/2024 in [facility] Emergency Department at [facility] ED from 03/01/2024 in [facility]  C-SSRS RISK CATEGORY No Risk No Risk No Risk    Psychiatric Specialty Exam  Presentation  General Appearance:Appropriate for Environment  Eye Contact:Good  Speech:Clear and Coherent  Speech Volume:Normal  Handedness:Right   Mood and Affect  Mood: Labile  Affect: Congruent   Thought Process  Thought Processes: Coherent  Descriptions of Associations:Intact  Orientation:Full (Time, Place and Person)  Thought Content:WDL  Diagnosis of Schizophrenia or Schizoaffective disorder in past: No   Hallucinations:None  Ideas of Reference:None  Suicidal Thoughts:No  Homicidal Thoughts:No   Sensorium  Memory: Immediate Fair; Recent Fair; Remote Fair  Judgment: Fair  Insight: Fair   Executive Functions  Concentration:  Fair  Attention Span: Fair  Recall: [employer] of  Knowledge: Fair  Language: Fair   Psychomotor Activity  Psychomotor Activity: Normal   Assets  Assets: Manufacturing systems engineer; Desire for Improvement; Physical Health   Sleep  Sleep: Fair  Number of hours: No data recorded  Physical Exam: Physical Exam HENT:     Head: Normocephalic and atraumatic.     Right Ear: Tympanic membrane normal.     Left Ear: Tympanic membrane normal.     Nose: Nose normal.  Eyes:     Extraocular Movements: Extraocular movements intact.     Pupils: Pupils are equal, round, and reactive to light.  Pulmonary:     Effort: Pulmonary effort is normal.  Musculoskeletal:        General: Normal range of motion.     Cervical back: Normal range of motion and neck supple.  Neurological:     General: No focal deficit present.     Mental Status: He is alert and oriented to person, place, and time.  Psychiatric:        Thought Content: Thought content normal.    Review of Systems  Constitutional: Negative.   HENT: Negative.    Eyes: Negative.   Respiratory: Negative.    Cardiovascular: Negative.   Gastrointestinal: Negative.   Genitourinary: Negative.   Musculoskeletal: Negative.   Skin: Negative.   Neurological: Negative.   Endo/Heme/Allergies: Negative.   Psychiatric/Behavioral:  Positive for depression and substance abuse.    Blood pressure (!) 139/94, pulse (!) 108, temperature 98.7 F (37.1 C), temperature source Oral, resp. rate 16, SpO2 96%. There is no height or weight on file to calculate BMI.  Musculoskeletal: Strength & Muscle Tone: within normal limits Gait & Station: normal Patient leans: N/A   [facility] MSE Discharge Disposition for Follow up and Recommendations: Patient will be admitted to [facility] for treatment. He is also looking for rehab services.    Randall Bouquet, NP 07/21/2024, 6:17 AM

## 2024-07-21 NOTE — H&P (Signed)
 Psychiatric Admission Assessment Adult  Patient Identification: William Allison MRN:  [id number] Date of Evaluation:  07/21/2024 Chief Complaint:  I'm done with this sh*t Principal Diagnosis: Alcohol dependence with alcohol-induced mood disorder (HCC) Diagnosis:  Principal Problem:   Alcohol dependence with alcohol-induced mood disorder (HCC) Active Problems:   Cocaine use disorder, mild, abuse (HCC)   Anxiety  History of Present Illness: William Allison is a 33 year old male with a history of severe polysubstance use disorder, presenting for evaluation and treatment of ongoing substance use, depression, and anxiety. He reports daily use of alcohol and crack cocaine for the past ten years. Specifically, he consumes a 24-pack of 8% ABV beer daily, drinking straight through the day. He spends approximately $200 per day on cocaine, consuming around 3 grams daily, he estimates. Of note, he is prescribed Adderall and clonazepam  by his PCP (confirmed in PDMP) for diagnosed ADHD and anxiety, which he reports taking as prescribed.  He is currently homeless, having lost his job and housing secondary to substance use. He reports a long-standing history of substance use beginning at age 66, with multiple prior episodes of withdrawal, but only one prior hospitalization for withdrawal. He denies any history of prior substance use disorder treatment. He expresses motivation for sobriety at this time, stating, I am just tired of doing drugs, I am not getting anywhere. He denies current or past suicidal or homicidal ideation, hallucinations, or self-harm behaviors. He reports a history of depression, anxiety, and ADHD. He reports being diagnosed with ADHD at age [age] and being prescribed medications for this continuously since. While he reports a history of depressive symptoms this has been in the context of continued alcohol use.   He has a history of alcohol-related withdrawal symptoms, including one episode  described as a seizure (unclear etiology, not consistent with true seizure per recent ED evaluation). He currently sees his primary care provider, Josette Aho, who manages his ADHD and anxiety with Adderall and clonazepam . He has no current outpatient psychiatric care beyond this and no insurance or income.  Past Psychiatric History:  Diagnoses: (per chart review) AUD, MDD, GAD, ADHD Previous Psychiatric Hospitalizations: Denies Previous Suicide Attempts: Denies. Previous Self-Harm: Denies. Previous Medications: Only mixed amphetamine  salts and clonazepam  Therapy: None ongoing. Prior Substance Use Treatment: Denies any history of prior SUD treatment or rehab.    Alcohol Screening:  Positive  Past Medical History:  Past Medical History:  Diagnosis Date   ADD (attention deficit disorder)    Constipation 05/30/12   [facility], [facility] Just; Donnatal q.i.d. Follow up in 4 weeks.   GERD (gastroesophageal reflux disease)     Past Surgical History:  Procedure Laterality Date   HAND SURGERY     for fracture   LAPAROSCOPIC APPENDECTOMY N/A 12/26/2016   Procedure: APPENDECTOMY LAPAROSCOPIC;  Surgeon: Morene Olives, MD;  Location: [facility];  Service: General;  Laterality: N/A;   TYMPANOSTOMY TUBE PLACEMENT     Family History: Father with AUD, reports mother has manic-depression  Tobacco Screening:  Social History   Tobacco Use  Smoking Status Every Day   Current packs/day: 0.50   Types: Cigarettes  Smokeless Tobacco Never    Social History: Marital Status: Single Children: 1 child, sees every once in a while, reports good relationship Living Situation: Homeless for ?1 year. Previously living with parent is [place]. 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 Performed at [facility], [address], [place], [place] [address]     Blood Alcohol level:  Lab Results  Component Value Date   ETH 72 (H) 07/21/2024   ETH 51 (H) 07/19/2024    Metabolic Disorder Labs:  No results found for: HGBA1C, MPG No results found for: PROLACTIN No results found for: CHOL, TRIG, HDL, CHOLHDL, VLDL, LDLCALC  Current Medications: Current Facility-Administered Medications  Medication Dose Route Frequency Provider Last Rate Last Admin   acetaminophen  (TYLENOL ) tablet 650 mg  650 mg Oral Q6H PRN White, Patrice L, NP       alum & mag hydroxide-simeth (MAALOX/MYLANTA) 200-200-20 MG/5ML suspension 30 mL  30 mL Oral Q4H PRN White, Patrice L, NP       chlordiazePOXIDE (LIBRIUM) capsule 25 mg  25 mg Oral Q6H PRN White, Patrice L, NP       chlordiazePOXIDE (LIBRIUM) capsule 25 mg  25 mg Oral QID White, Patrice L, NP   25 mg at 07/21/24 1142   Followed by   NOREEN ON 07/22/2024] chlordiazePOXIDE (LIBRIUM) capsule 25 mg  25 mg Oral TID White, Patrice L, NP       Followed by   NOREEN ON 07/23/2024] chlordiazePOXIDE (LIBRIUM) capsule 25 mg  25 mg Oral BH-qamhs White, Patrice L, NP       Followed by   NOREEN ON 07/25/2024] chlordiazePOXIDE (LIBRIUM) capsule 25 mg  25 mg Oral Daily White, Patrice L, NP       haloperidol  (HALDOL ) tablet 5 mg  5 mg Oral TID PRN White, Patrice L, NP       And   diphenhydrAMINE  (BENADRYL ) capsule 50 mg  50 mg Oral TID PRN White, Patrice L, NP       haloperidol  lactate (HALDOL ) injection 5 mg  5 mg Intramuscular TID PRN White, Patrice L, NP       And   diphenhydrAMINE  (BENADRYL ) injection 50 mg  50 mg Intramuscular TID PRN White, Patrice L, NP       And   LORazepam  (ATIVAN ) injection 2 mg  2 mg  Intramuscular TID PRN White, Patrice L, NP       haloperidol  lactate (HALDOL ) injection 10 mg  10 mg Intramuscular TID PRN White, Patrice L, NP       And   diphenhydrAMINE  (BENADRYL ) injection 50 mg  50 mg Intramuscular TID PRN White, Patrice L, NP       And   LORazepam  (ATIVAN ) injection 2 mg  2 mg Intramuscular TID PRN White, Patrice L, NP       hydrOXYzine  (ATARAX ) tablet 25 mg  25 mg Oral TID PRN White, Patrice L, NP       loperamide  (IMODIUM ) capsule 2-4 mg  2-4 mg Oral PRN White, Patrice L, NP       magnesium  hydroxide (MILK OF MAGNESIA) suspension 30 mL  30 mL Oral Daily PRN White, Patrice L, NP       [START ON 07/22/2024] multivitamin with minerals tablet 1 tablet  1 tablet Oral Daily White, Patrice L, NP       nicotine  polacrilex (NICORETTE ) gum 2 mg  2  mg Oral PRN Prentis Kitchens A, DO   2 mg at 07/21/24 1258   ondansetron  (ZOFRAN -ODT) disintegrating tablet 4 mg  4 mg Oral Q6H PRN White, Patrice L, NP       [START ON 07/22/2024] thiamine  (Vitamin B-1) tablet 100 mg  100 mg Oral Daily White, Patrice L, NP       traZODone  (DESYREL ) tablet 50 mg  50 mg Oral QHS PRN White, Patrice L, NP       PTA Medications: Medications Prior to Admission  Medication Sig Dispense Refill Last Dose/Taking   amphetamine -dextroamphetamine  (ADDERALL) 30 MG tablet Take 30 mg by mouth 2 (two) times daily.   Past Week   clonazePAM  (KLONOPIN ) 1 MG tablet Take 1 mg by mouth 2 (two) times daily.   Past Week      Musculoskeletal: Normal gait and station  Mental Status Exam: Appearance - Hospital paper scrubs, multiple tatoos, appropriate hygiene and grooming  Eye-Contact - Normal Attitude - Calm, polite, not guarded Speech - normal volume, prosody, inflection Mood - Not well Affect - Restricted, appears mildly intoxicated Thought Process - LLGD Thought Content - No delusional TC expressed SI/HI - Denies  Perceptions - Denies AVH; not RIS Judgement/Insight - Fair Fund of knowledge - WNL Language -  No impairments    Physical Exam Vitals reviewed.  Constitutional:      Appearance: Normal appearance. He is normal weight.  HENT:     Head: Normocephalic and atraumatic.  Eyes:     Extraocular Movements: Extraocular movements intact.  Musculoskeletal:        General: Normal range of motion.  Skin:    General: Skin is warm and dry.  Neurological:     General: No focal deficit present.     Mental Status: He is alert and oriented to person, place, and time.    Review of Systems  Constitutional: Negative.   Respiratory: Negative.    Cardiovascular: Negative.    Blood pressure 129/89, pulse (!) 106, temperature 98.2 F (36.8 C), temperature source Oral, resp. rate 16. There is no height or weight on file to calculate BMI.  Assessment and Plan: Mr. Searcy Miyoshi is a 33 y/o male, currently homeless, with a history of reported ADHD and anxiety, and a long history of heavy alcohol use and frequent to daily cocaine use who presented voluntarily for detoxification. His UDS was positive for amphetamines and benzodiazepines, which is consistent with his PDMP report. He presents still acutely intoxicated, with a BAL this morning of 72. He is at high risk for complicated withdrawal, and was started on CIWA protocol and scheduled librium taper upon admission.   # Alcohol use disorder, severe / currently intoxicated - CIWA with librium prn - Librium taper: 100 mg day 1, 75 mg day 2, 50 mg day 3, then 25 mg day 4 then stop.   # Alcohol induced Mood Disorder # ADHD, by history # Unspecified Anxiety Disorder, by history  Observation Level/Precautions:  15 minute checks  Laboratory:  UDS positive for amphetamines, benzos, THC, and cocaine. Etoh level 72 this morning at 7am.  Psychotherapy:  Group  Medications:  As above  Consultations:  SW  Discharge Concerns:  SUD treatment  Estimated LOS: 5-10 days     Physician Treatment Plan for Primary Diagnosis: Alcohol dependence with alcohol-induced  mood disorder (HCC) Long Term Goal(s): Improvement in symptoms so as ready for discharge  Short Term Goals: Ability to demonstrate self-control will improve, Ability to identify and develop effective  coping behaviors will improve, and Ability to identify triggers associated with substance abuse/mental health issues will improve  Physician Treatment Plan for Secondary Diagnosis: Principal Problem:   Alcohol dependence with alcohol-induced mood disorder (HCC) Active Problems:   Cocaine use disorder, mild, abuse (HCC)   Anxiety   I certify that inpatient services furnished can reasonably be expected to improve the patient's condition.    Oliva DELENA Salmon, DO 10/19/20251:27 PM

## 2024-07-21 NOTE — Group Note (Signed)
 Date:  07/21/2024 Time:  8:44 PM  Group Topic/Focus:  Wrap-Up Group:   The focus of this group is to help patients review their daily goal of treatment and discuss progress on daily workbooks.    Participation Level:  Active  Participation Quality:  Appropriate  Affect:  Appropriate  Cognitive:  Appropriate  Insight: Appropriate  Engagement in Group:  Engaged  Modes of Intervention:  Discussion  Additional Comments:   Pts day was a 7/10, anxiety at a 2, depression at a 6. 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

## 2024-07-21 NOTE — Plan of Care (Signed)
   Problem: Education: Goal: Emotional status will improve Outcome: Progressing Goal: Mental status will improve Outcome: Progressing

## 2024-07-21 NOTE — Progress Notes (Signed)
 Patient is a 33 year old male who presented from the [facility] under voluntary admission for complaints of substance abuse, and requesting detox. Pt has hx of polysubstance abuse, ADHD, GAD, and MDD. Pt reported that he has been homeless for the past year, and is tired of the way he has been living. Pt reported that he drinks daily, roughly a case of beer a day, and has been doing this for years. Pt currently denies SI/HI, A/VH and paranoia, and doesn't appear to be responding to internal stimuli. Pt presented anxious, but friendly and cooperative,answered questions logically and coherently throughout admission interview and assessment. VS monitored and recorded. Skin check performed with MHT Belongings searched and secured in locker. Patient was oriented to unit and schedule. Pt denies withdrawal symptoms, pain, and  SI/HI/AVH at this time. Q 15 min checks initiated for safety.

## 2024-07-21 NOTE — Group Note (Signed)
 Date:  07/21/2024 Time:  6:37 PM  Group Topic/Focus:  Making Healthy Choices:   The focus of this group is to help patients identify negative/unhealthy choices they were using prior to admission and identify positive/healthier coping strategies to replace them upon discharge. Relapse Prevention Planning:   The focus of this group is to define relapse and discuss the need for planning to combat relapse.  Watched video of addiction behavior and it's triggers and possible solutions. Gave handouts with description of video and thought provoking questons. Personal sharing and audience sharing.   Participation Level:  Active  Participation Quality:  Appropriate  Affect:  Appropriate  Cognitive:  Appropriate  Insight: Appropriate  Engagement in Group:  Engaged  Modes of Intervention:  Discussion and Education  Additional Comments:    Juliene CHRISTELLA Huddle 07/21/2024, 6:37 PM

## 2024-07-21 NOTE — Progress Notes (Signed)
   07/21/24 0519  [facility] Triage Screening (Walk-ins at [facility] only)  How Did You Hear About Us ? Hospital Discharge  What Is the Reason for Your Visit/Call Today? Patient present to [facility] voluntarily with [employer]. Patient states he was in the hospital 2 days ago and was told to come here for behavioral crisis and detox services. Patient currently wants detox, rehab services. Patient's substance of choice is alcohol and crack that he has been taking continously the past 24 hours. Patient has diagnoses of ADHD and is prescribed adderall that he has also been taking the past 24 hours. Patient denies SI, HI, AVH.  How Long Has This Been Causing You Problems? <Week  Have You Recently Had Any Thoughts About Hurting Yourself? No  Are You Planning to Commit Suicide/Harm Yourself At This time? No  Have you Recently Had Thoughts About Hurting Someone Sherral? No  Are You Planning To Harm Someone At This Time? No  Physical Abuse Denies  Verbal Abuse Yes, past (Comment) (years ago)  Sexual Abuse Denies  Exploitation of patient/patient's resources Denies  Self-Neglect Denies  Are you currently experiencing any auditory, visual or other hallucinations? No  Have You Used Any Alcohol or Drugs in the Past 24 Hours? Yes  What Did You Use and How Much? Alcohol and Crack. Continously for the past 24 hours.  Do you have any current medical co-morbidities that require immediate attention? No  Clinician description of patient physical appearance/behavior: Normal appearance, raspy voice at times.  What Do You Feel Would Help You the Most Today? Alcohol or Drug Use Treatment;Housing Assistance  If access to [facility] was not available, would you have sought care in the Emergency Department? Yes  Determination of Need Routine (7 days)  Options For Referral Other: Comment;Facility-Based Crisis;Chemical Dependency Intensive Outpatient Therapy (CDIOP);Intensive Outpatient Therapy;Outpatient Therapy

## 2024-07-21 NOTE — Progress Notes (Signed)
   07/21/24 2229  Psych Admission Type (Psych Patients Only)  Admission Status Voluntary  Psychosocial Assessment  Patient Complaints Substance abuse  Eye Contact Fair  Facial Expression Animated  Affect Appropriate to circumstance  Speech Logical/coherent  Interaction Assertive  Motor Activity Other (Comment) (WDL)  Appearance/Hygiene Disheveled  Behavior Characteristics Appropriate to situation  Mood Anxious;Pleasant  Thought Process  Coherency WDL  Content WDL  Delusions None reported or observed  Perception WDL  Hallucination None reported or observed  Judgment WDL  Confusion None  Danger to Self  Current suicidal ideation? Denies  Agreement Not to Harm Self Yes  Description of Agreement verbal  Danger to Others  Danger to Others None reported or observed

## 2024-07-21 NOTE — ED Notes (Signed)
 Pt denies SI/HI/AVH. Verbally contracts for safety provided. Pt presents as sad; while concerned w/substance & alcohol abuse. Skin check performed w/tattoos, acne on chest/back area and scratch marks; please see flowsheet for details. PT searched and no contraband found, POC and unit policies explained and understanding verbalized. Food and fluids offered, both accepted. Pt had no additional questions or concerns. Safety maintained/ongoing.

## 2024-07-21 NOTE — ED Notes (Signed)
 Patient alert & oriented x4. Denies intent to harm self or others when asked. Denies A/VH. Patient denies any physical complaints when asked. No acute distress noted. Support and encouragement provided. Patient observed in milieu. No inappropriate behaviors observed or reported. Patient set to transfer to [facility], patient voices no concern regarding plan to transfer. Routine safety checks conducted per facility protocol. Encouraged patient to notify staff if any thoughts of harm towards self or others arise. Patient verbalizes understanding and agreement.

## 2024-07-21 NOTE — Discharge Instructions (Signed)
 Transfer to [facility]

## 2024-07-21 NOTE — Group Note (Signed)
 Date:  07/21/2024 Time:  12:03 PM  Group Topic/Focus:  Emotional Education:  The group explored themes of acceptance, change, resilience, and control through song analysis and a lyric substitution activity. The goal of the group is to increase self awareness, emotional expression, autonomy, and self-esteem.   Participation Level:  Minimal  Participation Quality:  Drowsy  Affect:  Appropriate  Cognitive:  Appropriate  Insight: Appropriate  Engagement in Group:  Developing/Improving  Modes of Intervention:  Activity and Discussion  Additional Comments:    Ravi Tuccillo D Monesha Monreal 07/21/2024, 12:03 PM

## 2024-07-21 NOTE — [facility] Suicide Risk Assessment (Signed)
 [facility] INPATIENT:  Family/Significant Other Suicide Prevention Education  Suicide Prevention Education:  Contact Attempts: [facility], mom, [phone], has been identified by the patient as the family member/significant other with whom the patient will be residing, and identified as the person(s) who will aid the patient in the event of a mental health crisis.  With written consent from the patient, two attempts were made to provide suicide prevention education, prior to and/or following the patient's discharge.  We were unsuccessful in providing suicide prevention education.  A suicide education pamphlet was given to the patient to share with family/significant other.  Date and time of first attempt: 07/21/24 at 11:11 am   William Allison 07/21/2024, 11:11 AM

## 2024-07-21 NOTE — Tx Team (Signed)
 Initial Treatment Plan 07/21/2024 8:13 PM William Allison FMW:[id number]    PATIENT STRESSORS: Financial difficulties   Occupational concerns   Substance abuse     PATIENT STRENGTHS: Ability for insight  Average or above average intelligence  Capable of independent living  Communication skills  Motivation for treatment/growth  Physical Health    PATIENT IDENTIFIED PROBLEMS: Substance abuse    I don't want to live like this anymore                 DISCHARGE CRITERIA:  Adequate post-discharge living arrangements Improved stabilization in mood, thinking, and/or behavior Need for constant or close observation no longer present Reduction of life-threatening or endangering symptoms to within safe limits Verbal commitment to aftercare and medication compliance Withdrawal symptoms are absent or subacute and managed without 24-hour nursing intervention  PRELIMINARY DISCHARGE PLAN: Attend 12-step recovery group Outpatient therapy Placement in alternative living arrangements  PATIENT/FAMILY INVOLVEMENT: This treatment plan has been presented to and reviewed with the patient, William Allison,   The patient has been given the opportunity to ask questions and make suggestions.  Berwyn GORMAN Acosta, RN 07/21/2024, 8:13 PM

## 2024-07-22 ENCOUNTER — Encounter (HOSPITAL_COMMUNITY): Payer: Self-pay

## 2024-07-22 MED ORDER — CITALOPRAM HYDROBROMIDE 10 MG PO TABS
10.0000 mg | ORAL_TABLET | Freq: Every day | ORAL | Status: DC
Start: 2024-07-23 — End: 2024-07-23
  Administered 2024-07-23: 10 mg via ORAL
  Filled 2024-07-22: qty 1

## 2024-07-22 NOTE — Progress Notes (Signed)
 [facility] MD Progress Note 07/22/2024 12:23 PM William Allison  MRN:  [id number] Principal Problem: Alcohol dependence with alcohol-induced mood disorder (HCC) Diagnosis: Principal Problem:   Alcohol dependence with alcohol-induced mood disorder (HCC) Active Problems:   Cocaine use disorder, mild, abuse (HCC)   Anxiety   Subjective:   William Allison is a 88 male with a Hx of polysubstance abuse who presents for anxiety and detox.  Chart Review from last 24 hours and discussion during bed progression: The patient's chart was reviewed and nursing notes were reviewed. The patient's case was discussed in multidisciplinary team meeting.   - events per chart review / staff report: none - Patient received all scheduled medications  Per Patient:  On assessment today, the patient reports feeling better, denis symptoms of psychological or physical withdrawal. CIWA scores are 5 and 4. VSS Denies SI, HI nad AVH. Sleep and app are stable. Mood is reported as good and generally easy to interview. He reports a desire to get off of drugs though reluctant to stop BDZ.  No interested in medications for his mood at this time. Discussled starting Celexa if he is willing.   Sleep: Good Appetite:  Good Depression: mild Anxiety: mild Auditory Hallucinations: neg Visual Hallucinations: neg Paranoia: neg HI: neg SI: neg Side effects from medications: does not endorse any side-effects they attribute to medications. Other concerns discussed with patient:  ROS  Total time spent with patient: 45 minutes   Past Medical History (auto-populated):  Past Medical History:  Diagnosis Date   ADD (attention deficit disorder)    Constipation 05/30/12   [facility], Belvie Just; Donnatal q.i.d. Follow up in 4 weeks.   GERD (gastroesophageal reflux disease)     Past Surgical History:  Procedure Laterality Date   HAND SURGERY     for fracture   LAPAROSCOPIC APPENDECTOMY N/A 12/26/2016   Procedure:  APPENDECTOMY LAPAROSCOPIC;  Surgeon: Morene Olives, MD;  Location: [facility];  Service: General;  Laterality: N/A;   TYMPANOSTOMY TUBE PLACEMENT      Social History:  Social History   Substance and Sexual Activity  Alcohol Use Yes   Alcohol/week: 24.0 standard drinks of alcohol   Types: 24 Cans of beer per week   Comment: drinks a case of beer a day     Social History   Substance and Sexual Activity  Drug Use Yes   Types: Marijuana, Cocaine, Methamphetamines    Social History   Socioeconomic History   Marital status: Single    Spouse name: Not on file   Number of children: Not on file   Years of education: Not on file   Highest education level: Not on file  Occupational History   Not on file  Tobacco Use   Smoking status: Every Day    Current packs/day: 0.50    Average packs/day: 0.5 packs/day for 0.8 years (0.4 ttl pk-yrs)    Types: Cigarettes    Start date: 2025   Smokeless tobacco: Never  Vaping Use   Vaping status: Never Used  Substance and Sexual Activity   Alcohol use: Yes    Alcohol/week: 24.0 standard drinks of alcohol    Types: 24 Cans of beer per week    Comment: drinks a case of beer a day   Drug use: Yes    Types: Marijuana, Cocaine, Methamphetamines   Sexual activity: Yes  Other Topics Concern   Not on file  Social History Narrative   Not on file   Social  Drivers of Corporate investment banker Strain: Not on file  Food Insecurity: Food Insecurity Present (07/21/2024)   Hunger Vital Sign    Worried About Running Out of Food in the Last Year: Often true    Ran Out of Food in the Last Year: Often true  Transportation Needs: Unmet Transportation Needs (07/21/2024)   PRAPARE - Administrator, Civil Service (Medical): Yes    Lack of Transportation (Non-Medical): Yes  Physical Activity: Not on file  Stress: Not on file  Social Connections: Not on file   Additional Social History:   Homeless  Objective: Medications, Labs, Mental  Status Exam, Physical Exam Current Medications: Current Facility-Administered Medications  Medication Dose Route Frequency Provider Last Rate Last Admin   acetaminophen  (TYLENOL ) tablet 650 mg  650 mg Oral Q6H PRN White, Patrice L, NP   650 mg at 07/21/24 1527   alum & mag hydroxide-simeth (MAALOX/MYLANTA) 200-200-20 MG/5ML suspension 30 mL  30 mL Oral Q4H PRN White, Patrice L, NP       chlordiazePOXIDE (LIBRIUM) capsule 25 mg  25 mg Oral Q6H PRN White, Patrice L, NP       chlordiazePOXIDE (LIBRIUM) capsule 25 mg  25 mg Oral TID White, Patrice L, NP   25 mg at 07/22/24 1204   Followed by   NOREEN ON 07/23/2024] chlordiazePOXIDE (LIBRIUM) capsule 25 mg  25 mg Oral BH-qamhs White, Patrice L, NP       Followed by   NOREEN ON 07/25/2024] chlordiazePOXIDE (LIBRIUM) capsule 25 mg  25 mg Oral Daily White, Patrice L, NP       haloperidol  (HALDOL ) tablet 5 mg  5 mg Oral TID PRN White, Patrice L, NP       And   diphenhydrAMINE  (BENADRYL ) capsule 50 mg  50 mg Oral TID PRN White, Patrice L, NP       haloperidol  lactate (HALDOL ) injection 5 mg  5 mg Intramuscular TID PRN White, Patrice L, NP       And   diphenhydrAMINE  (BENADRYL ) injection 50 mg  50 mg Intramuscular TID PRN White, Patrice L, NP       And   LORazepam  (ATIVAN ) injection 2 mg  2 mg Intramuscular TID PRN White, Patrice L, NP       haloperidol  lactate (HALDOL ) injection 10 mg  10 mg Intramuscular TID PRN White, Patrice L, NP       And   diphenhydrAMINE  (BENADRYL ) injection 50 mg  50 mg Intramuscular TID PRN White, Patrice L, NP       And   LORazepam  (ATIVAN ) injection 2 mg  2 mg Intramuscular TID PRN White, Patrice L, NP       hydrOXYzine  (ATARAX ) tablet 25 mg  25 mg Oral TID PRN White, Patrice L, NP   25 mg at 07/21/24 2100   loperamide  (IMODIUM ) capsule 2-4 mg  2-4 mg Oral PRN White, Patrice L, NP       magnesium  hydroxide (MILK OF MAGNESIA) suspension 30 mL  30 mL Oral Daily PRN White, Patrice L, NP       multivitamin with minerals  tablet 1 tablet  1 tablet Oral Daily White, Patrice L, NP   1 tablet at 07/22/24 0730   nicotine  polacrilex (NICORETTE ) gum 2 mg  2 mg Oral PRN Prentis Kitchens A, DO   2 mg at 07/22/24 1100   ondansetron  (ZOFRAN -ODT) disintegrating tablet 4 mg  4 mg Oral Q6H PRN Teresa Wyline CROME, NP  thiamine  (Vitamin B-1) tablet 100 mg  100 mg Oral Daily White, Patrice L, NP   100 mg at 07/22/24 0730   traZODone  (DESYREL ) tablet 50 mg  50 mg Oral QHS PRN White, Patrice L, NP   50 mg at 07/21/24 2100    Lab Results:  Results for orders placed or performed during the hospital encounter of 07/21/24 (from the past 48 hours)  Ethanol     Status: Abnormal   Collection Time: 07/21/24  6:58 AM  Result Value Ref Range   Alcohol, Ethyl (B) 72 (H) <15 mg/dL    Comment: (NOTE) For medical purposes only. Performed at [facility], [address] [place], [place] [address]     Blood Alcohol level:  Lab Results  Component Value Date   ETH 72 (H) 07/21/2024   ETH 51 (H) 07/19/2024    Metabolic Disorder Labs: No results found for: HGBA1C, MPG No results found for: PROLACTIN No results found for: CHOL, TRIG, HDL, CHOLHDL, VLDL, LDLCALC  Physical Findings: AIMS:  , ,  ,  ,    CIWA:  CIWA-Ar Total: 4 COWS:     Musculoskeletal: Strength & Muscle Tone: within normal limits Gait & Station: normal Patient leans: N/A  Mental Status Exam: General Appearance: Disheveled  Orientation:  Full (Time, Place, and Person)  Memory:  Immediate;   Good Remote;   Good  Concentration:  Concentration: Good  Recall:  Good  Attention  Good  Eye Contact:  Good  Speech:  Clear and Coherent  Language:  Good  Volume:  Normal  Mood:  Pretty Good   Affect:  Full Range  Thought Process:  Coherent  Thought Content:  Logical  Suicidal Thoughts:  No  Homicidal Thoughts:  No  Judgement:  Fair  Insight:  Fair  Psychomotor Activity:  Normal  Akathisia:  No  Fund of Knowledge:  Fair       Assets:  Physical Health  Cognition:  WNL  ADL's:  Intact   Physical Exam: Physical Exam  Blood pressure (!) 116/90, pulse 79, temperature 98.2 F (36.8 C), temperature source Oral, resp. rate 20, height 6' 4 (1.93 m), weight 86.8 kg, SpO2 100%. Body mass index is 23.3 kg/m.  ASSESSMENT:  Diagnoses / Active Problems: Diagnosis:  Principal Problem:   Alcohol dependence with alcohol-induced mood disorder (HCC) Active Problems:   Cocaine use disorder, mild, abuse (HCC)   Anxiety  William Allison is a 33 y/o male, currently homeless, with a history of reported ADHD and anxiety, and a long history of heavy alcohol use and frequent to daily cocaine use who presented voluntarily for detoxification. His UDS was positive for amphetamines and benzodiazepines, which is consistent with his PDMP report. He presents still acutely intoxicated, with a BAL this morning of 72. He is at high risk for complicated withdrawal, and was started on CIWA protocol and scheduled librium taper upon admission.   07/22/2024 -mood, affect and behavior are stable. Comfortable and withdrawal is mild, CIWA 4,5. VSS. Discussed starting Celexa with patient regarding mood, though is reluctant to start any meds. Not interested in stopping BDZ or stimulants.    PLAN: Safety and Monitoring:  -- Voluntary admission to inpatient psychiatric unit for safety, stabilization and treatment  -- Daily contact with patient to assess and evaluate symptoms and progress in treatment  -- Patient's case to be discussed in multi-disciplinary team meeting  -- Observation Level : q15 minute checks  -- Vital signs:  q12 hours  --  Precautions: suicide, elopement, and assault  2. Psychiatric Diagnoses and Treatment:  -Start Celexa 10mg  for mood - CIWA with librium prn - Librium taper: 100 mg day 1, 75 mg day 2, 50 mg day 3, then 25 mg day 4 then stop  The risks/benefits/side-effects/alternatives to this medication were discussed in  detail with the patient and time was given for questions. The patient consents to medication trial.  Metabolic profile and EKG monitoring obtained while on an atypical antipsychotic  Encouraged patient to participate in unit milieu and in scheduled group therapies   -- Short Term Goals: Ability to verbalize feelings will improve, Ability to identify and develop effective coping behaviors will improve, and Ability to maintain clinical measurements within normal limits will improve  -- Long Term Goals: Improvement in symptoms so as ready for discharge    3. Medical Issues Being Addressed:   Substance abuse and withdrawal  Labs reviewed,  UDS positive for amphetamines, benzos, THC, and cocaine. Etoh level 72 this morning at 7am.   Tobacco Use Disorder  --  Patient in need of nicotine  replacement; nicotine  polacrilex (gum) ordered. Smoking cessation encouraged  -- Smoking cessation encouraged  4. Discharge Planning:   -- Social work and case management to assist with discharge planning and identification of hospital follow-up needs prior to discharge  -- Estimated discharge: 5 days  -- Discharge Concerns: Need to establish a safety plan; Medication compliance and effectiveness  -- Discharge Goals: Return home with outpatient referrals for mental health follow-up including medication management/psychotherapy   Lamar Sherlean Jama Chandra, DO 07/22/2024, 12:23 PM

## 2024-07-22 NOTE — Group Note (Signed)
 Date:  07/22/2024 Time:  10:03 AM  Group Topic/Focus:  Goals Group:   The focus of this group is to help patients establish daily goals to achieve during treatment and discuss how the patient can incorporate goal setting into their daily lives to aide in recovery.    Participation Level:  Did Not Attend   Dolores CHRISTELLA Fredericks 07/22/2024, 10:03 AM

## 2024-07-22 NOTE — Group Note (Signed)
 Recreation Therapy Group Note   Group Topic:Problem Solving  Group Date: 07/22/2024 Start Time: 0941 End Time: 1002 Facilitators: Nickie Deren-McCall, LRT,CTRS Location: [address] Dayroom   Group Topic: Communication, Team Building, Problem Solving  Goal Area(s) Addresses:  Patient will effectively work with peer towards shared goal.  Patient will identify skills used to make activity successful.  Patient will identify how skills used during activity can be used to reach post d/c goals.   Behavioral Response:   Intervention: STEM Activity  Activity: Straw Bridge. In teams of 3-5, patients were given 15 plastic drinking straws and an equal length of masking tape. Using the materials provided, patients were instructed to build a free standing bridge-like structure to suspend an everyday item (ex: puzzle box) off of the floor or table surface. All materials were required to be used by the team in their design. LRT facilitated post-activity discussion reviewing team process. Patients were encouraged to reflect how the skills used in this activity can be generalized to daily life post discharge.   Education: Pharmacist, community, Scientist, physiological, Discharge Planning   Education Outcome: Acknowledges education/In group clarification offered/Needs additional education.    Affect/Mood: N/A   Participation Level: Did not attend    Clinical Observations/Individualized Feedback:     Plan: Continue to engage patient in RT group sessions 2-3x/week.   Austine Kelsay-McCall, LRT,CTRS 07/22/2024 10:20 AM

## 2024-07-22 NOTE — Progress Notes (Signed)
(  Sleep Hours) - 6.5 hours (Any PRNs that were needed, meds refused, or side effects to meds)-  Trazodone , Vistaril , nicorette   given (Any disturbances and when (visitation, over night)- None (Concerns raised by the patient)-  None (SI/HI/AVH)-  Denies

## 2024-07-22 NOTE — Group Note (Signed)
 Date:  07/22/2024 Time:  3:33 PM  Group Topic/Focus: Occupational Therapy Group  Occupational therapy    Participation Level:  Active  Participation Quality:  Appropriate  Affect:  Appropriate  Cognitive:  Appropriate  Insight: Appropriate  Engagement in Group:  Engaged  Modes of Intervention:  Discussion  Additional Comments:  Patient attended group.   William Allison 07/22/2024, 3:33 PM

## 2024-07-22 NOTE — BH IP Treatment Plan (Signed)
 Interdisciplinary Treatment and Diagnostic Plan Update  07/22/2024 Time of Session: 10:35AM William Allison MRN: [id number]  Principal Diagnosis: Alcohol dependence with alcohol-induced mood disorder (HCC)  Secondary Diagnoses: Principal Problem:   Alcohol dependence with alcohol-induced mood disorder (HCC) Active Problems:   Cocaine use disorder, mild, abuse (HCC)   Anxiety   Current Medications:  Current Facility-Administered Medications  Medication Dose Route Frequency Provider Last Rate Last Admin   acetaminophen  (TYLENOL ) tablet 650 mg  650 mg Oral Q6H PRN White, Patrice L, NP   650 mg at 07/21/24 1527   alum & mag hydroxide-simeth (MAALOX/MYLANTA) 200-200-20 MG/5ML suspension 30 mL  30 mL Oral Q4H PRN White, Patrice L, NP       chlordiazePOXIDE (LIBRIUM) capsule 25 mg  25 mg Oral Q6H PRN White, Patrice L, NP       chlordiazePOXIDE (LIBRIUM) capsule 25 mg  25 mg Oral TID White, Patrice L, NP   25 mg at 07/22/24 1204   Followed by   NOREEN ON 07/23/2024] chlordiazePOXIDE (LIBRIUM) capsule 25 mg  25 mg Oral BH-qamhs White, Patrice L, NP       Followed by   NOREEN ON 07/25/2024] chlordiazePOXIDE (LIBRIUM) capsule 25 mg  25 mg Oral Daily White, Patrice L, NP       [START ON 07/23/2024] citalopram (CELEXA) tablet 10 mg  10 mg Oral Daily Chandra Charleston Christian Jama, DO       haloperidol  (HALDOL ) tablet 5 mg  5 mg Oral TID PRN White, Patrice L, NP       And   diphenhydrAMINE  (BENADRYL ) capsule 50 mg  50 mg Oral TID PRN White, Patrice L, NP       haloperidol  lactate (HALDOL ) injection 5 mg  5 mg Intramuscular TID PRN White, Patrice L, NP       And   diphenhydrAMINE  (BENADRYL ) injection 50 mg  50 mg Intramuscular TID PRN White, Patrice L, NP       And   LORazepam  (ATIVAN ) injection 2 mg  2 mg Intramuscular TID PRN White, Patrice L, NP       haloperidol  lactate (HALDOL ) injection 10 mg  10 mg Intramuscular TID PRN White, Patrice L, NP       And   diphenhydrAMINE  (BENADRYL ) injection 50  mg  50 mg Intramuscular TID PRN White, Patrice L, NP       And   LORazepam  (ATIVAN ) injection 2 mg  2 mg Intramuscular TID PRN White, Patrice L, NP       hydrOXYzine  (ATARAX ) tablet 25 mg  25 mg Oral TID PRN White, Patrice L, NP   25 mg at 07/21/24 2100   loperamide  (IMODIUM ) capsule 2-4 mg  2-4 mg Oral PRN White, Patrice L, NP       magnesium  hydroxide (MILK OF MAGNESIA) suspension 30 mL  30 mL Oral Daily PRN White, Patrice L, NP       multivitamin with minerals tablet 1 tablet  1 tablet Oral Daily White, Patrice L, NP   1 tablet at 07/22/24 0730   nicotine  polacrilex (NICORETTE ) gum 2 mg  2 mg Oral PRN Prentis Kitchens A, DO   2 mg at 07/22/24 1100   ondansetron  (ZOFRAN -ODT) disintegrating tablet 4 mg  4 mg Oral Q6H PRN White, Patrice L, NP       thiamine  (Vitamin B-1) tablet 100 mg  100 mg Oral Daily White, Patrice L, NP   100 mg at 07/22/24 0730   traZODone  (DESYREL ) tablet 50 mg  50 mg Oral QHS PRN White, Patrice L, NP   50 mg at 07/21/24 2100   PTA Medications: Medications Prior to Admission  Medication Sig Dispense Refill Last Dose/Taking   amphetamine -dextroamphetamine  (ADDERALL) 30 MG tablet Take 30 mg by mouth 2 (two) times daily.   Past Week   clonazePAM  (KLONOPIN ) 1 MG tablet Take 1 mg by mouth 2 (two) times daily.   Past Week    Patient Stressors: Financial difficulties   Occupational concerns   Substance abuse    Patient Strengths: Ability for insight  Average or above average intelligence  Capable of independent living  Printmaker for treatment/growth  Physical Health   Treatment Modalities: Medication Management, Group therapy, Case management,  1 to 1 session with clinician, Psychoeducation, Recreational therapy.   Physician Treatment Plan for Primary Diagnosis: Alcohol dependence with alcohol-induced mood disorder (HCC) Long Term Goal(s): Improvement in symptoms so as ready for discharge   Short Term Goals: Ability to verbalize feelings will  improve Ability to identify and develop effective coping behaviors will improve Ability to maintain clinical measurements within normal limits will improve  Medication Management: Evaluate patient's response, side effects, and tolerance of medication regimen.  Therapeutic Interventions: 1 to 1 sessions, Unit Group sessions and Medication administration.  Evaluation of Outcomes: Not Progressing  Physician Treatment Plan for Secondary Diagnosis: Principal Problem:   Alcohol dependence with alcohol-induced mood disorder (HCC) Active Problems:   Cocaine use disorder, mild, abuse (HCC)   Anxiety  Long Term Goal(s): Improvement in symptoms so as ready for discharge   Short Term Goals: Ability to verbalize feelings will improve Ability to identify and develop effective coping behaviors will improve Ability to maintain clinical measurements within normal limits will improve     Medication Management: Evaluate patient's response, side effects, and tolerance of medication regimen.  Therapeutic Interventions: 1 to 1 sessions, Unit Group sessions and Medication administration.  Evaluation of Outcomes: Not Progressing   RN Treatment Plan for Primary Diagnosis: Alcohol dependence with alcohol-induced mood disorder (HCC) Long Term Goal(s): Knowledge of disease and therapeutic regimen to maintain health will improve  Short Term Goals: Ability to remain free from injury will improve, Ability to verbalize frustration and anger appropriately will improve, Ability to demonstrate self-control, Ability to participate in decision making will improve, and Ability to verbalize feelings will improve  Medication Management: RN will administer medications as ordered by provider, will assess and evaluate patient's response and provide education to patient for prescribed medication. RN will report any adverse and/or side effects to prescribing provider.  Therapeutic Interventions: 1 on 1 counseling sessions,  Psychoeducation, Medication administration, Evaluate responses to treatment, Monitor vital signs and CBGs as ordered, Perform/monitor CIWA, COWS, AIMS and Fall Risk screenings as ordered, Perform wound care treatments as ordered.  Evaluation of Outcomes: Not Progressing   LCSW Treatment Plan for Primary Diagnosis: Alcohol dependence with alcohol-induced mood disorder (HCC) Long Term Goal(s): Safe transition to appropriate next level of care at discharge, Engage patient in therapeutic group addressing interpersonal concerns.  Short Term Goals: Engage patient in aftercare planning with referrals and resources, Increase social support, Increase ability to appropriately verbalize feelings, Increase emotional regulation, and Facilitate acceptance of mental health diagnosis and concerns  Therapeutic Interventions: Assess for all discharge needs, 1 to 1 time with Social worker, Explore available resources and support systems, Assess for adequacy in community support network, Educate family and significant other(s) on suicide prevention, Complete Psychosocial Assessment, Interpersonal group therapy.  Evaluation of Outcomes:  Not Progressing   Progress in Treatment: Attending groups: No. Participating in groups: No. Taking medication as prescribed: Yes. Toleration medication: Yes. Family/Significant other contact made: No, will contact:  [facility], mom, [phone]        Patient understands diagnosis: Yes. Discussing patient identified problems/goals with staff: Yes. Medical problems stabilized or resolved: Yes. Denies suicidal/homicidal ideation: Yes. Issues/concerns per patient self-inventory: No.  Patient Goals:  sobriety and employment  Discharge Plan or Barriers: Patient to likely discharge to home or substance use treatment program.  Reason for Continuation of Hospitalization: Medication stabilization Withdrawal symptoms  Estimated Length of Stay: 5-7 days  Last 3 [place]  Suicide Severity Risk Score: Flowsheet Row [facility] (Current[facility] Most recent reading at 07/21/2024 10:00 AM ED from 07/21/2024 in [facility] Most recent reading at 07/21/2024  7:24 AM ED from 07/19/2024 in [facility] Most recent reading at 07/19/2024  8:27 AM  C-SSRS RISK CATEGORY No Risk No Risk No Risk    Last PHQ 2/9 Scores:    07/21/2024    6:12 AM 03/02/2024   10:31 AM 08/12/2023   10:55 AM  Depression screen PHQ 2/9  Decreased Interest 2 0 0  Down, Depressed, Hopeless 2 1 0  PHQ - 2 Score 4 1 0  Altered sleeping 2 1   Tired, decreased energy 2 1   Change in appetite 0 0   Feeling bad or failure about yourself  2 2   Trouble concentrating 1 2   Moving slowly or fidgety/restless 0 0   Suicidal thoughts  0   PHQ-9 Score 11 7   Difficult doing work/chores Very difficult      Scribe for Treatment Team: Nnaemeka Samson M Aeriel Boulay, LCSWA 07/22/2024 1:25 PM

## 2024-07-22 NOTE — Group Note (Deleted)
 Date:  07/22/2024 Time:  8:08 PM  Group Topic/Focus:        Participation Level:  {BHH PARTICIPATION OZCZO:77735}  Participation Quality:  {BHH PARTICIPATION QUALITY:22265}  Affect:  {BHH AFFECT:22266}  Cognitive:  {BHH COGNITIVE:22267}  Insight: {BHH Insight2:20797}  Engagement in Group:  {BHH ENGAGEMENT IN HMNLE:77731}  Modes of Intervention:  {BHH MODES OF INTERVENTION:22269}  Additional Comments:  ***  Perfecto Purdy A Mozell Hardacre 07/22/2024, 8:08 PM

## 2024-07-22 NOTE — BHH Counselor (Signed)
 CSW spoke with patient regarding treatment options for substance use. CSW gave patient list of substance use treatment facilities, both residential and IOP, should he want referrals to be sent. Patient reported he'd look at it overnight and let social worker know which one he's interested in on 10/21.  Khalil Belote, LCSWA 07/22/24 4:16PM

## 2024-07-22 NOTE — Group Note (Signed)
 Occupational Therapy Group Note  Group Topic: Sleep Hygiene  Group Date: 07/22/2024 Start Time: 1500 End Time: 1538 Facilitators: Dot [place] MATSU, OT   Group Description: Group encouraged increased participation and engagement through topic focused on sleep hygiene. Patients reflected on the quality of sleep they typically receive and identified areas that need improvement. Group was given background information on sleep and sleep hygiene, including common sleep disorders. Group members also received information on how to improve one's sleep and introduced a sleep diary as a tool that can be utilized to track sleep quality over a length of time. Group session ended with patients identifying one or more strategies they could utilize or implement into their sleep routine in order to improve overall sleep quality.        Therapeutic Goal(s):  Identify one or more strategies to improve overall sleep hygiene  Identify one or more areas of sleep that are negatively impacted (sleep too much, too little, etc)     Participation Level: Engaged   Participation Quality: Independent   Behavior: Appropriate   Speech/Thought Process: Relevant   Affect/Mood: Appropriate   Insight: Fair   Judgement: Fair      Modes of Intervention: Education  Patient Response to Interventions:  Attentive   Plan: Continue to engage patient in OT groups 2 - 3x/week.  07/22/2024  [place] MATSU Dot, OT   Amber Guthridge, OT

## 2024-07-22 NOTE — Group Note (Signed)
 Date:  07/22/2024 Time:  8:10 PM  Group Topic/Focus:  Wrap-Up Group:   The focus of this group is to help patients review their daily goal of treatment and discuss progress on daily workbooks.    Participation Level:  Active  Participation Quality:  Appropriate  Affect:  Appropriate  Cognitive:  Appropriate  Insight: Appropriate  Engagement in Group:  Engaged  Modes of Intervention:  Discussion  Additional Comments:   Pt attended AA meeting  Treg Diemer A Loralei Radcliffe 07/22/2024, 8:10 PM

## 2024-07-22 NOTE — Plan of Care (Signed)
   Problem: Education: Goal: Knowledge of [place] [facility] Education information/materials will improve Outcome: Progressing Goal: Emotional status will improve Outcome: Progressing Goal: Mental status will improve Outcome: Progressing Goal: Verbalization of understanding the information provided will improve Outcome: Progressing

## 2024-07-22 NOTE — Progress Notes (Signed)

## 2024-07-22 NOTE — Progress Notes (Addendum)
 Patient denies SI/HI/AVH this morning. Pt reports that he slept well last night. Pt's BP elevated this morning; 149/84. Pt reports multiple symptoms r/t alcohol detox including mild tremor, mild headache, and mild anxiety. Pt has been interactive on the unit and participating in groups throughout the day. Pt has been calm and cooperative throughout the day. Patient has been compliant with medications and treatment plan. Q 15 minute safety checks are in place for patient's safety. Patient is currently safe on the unit.   07/22/24 0827  Psych Admission Type (Psych Patients Only)  Admission Status Voluntary  Psychosocial Assessment  Patient Complaints Substance abuse  Eye Contact Fair  Facial Expression Animated  Affect Appropriate to circumstance  Speech Logical/coherent  Interaction Assertive  Motor Activity Fidgety  Appearance/Hygiene Disheveled  Behavior Characteristics Cooperative  Mood Pleasant  Thought Process  Coherency WDL  Content WDL  Delusions None reported or observed  Perception WDL  Hallucination None reported or observed  Judgment Impaired  Confusion None  Danger to Self  Current suicidal ideation? Denies  Description of Suicide Plan No plan verbalized  Agreement Not to Harm Self Yes  Description of Agreement Pt verbally contracts for safety  Danger to Others  Danger to Others None reported or observed

## 2024-07-22 NOTE — Group Note (Signed)
 Date:  07/22/2024 Time:  1:25 PM  Group Topic/Focus:  [place]in Group    Participation Level:  Did Not Attend   Dolores CHRISTELLA Fredericks 07/22/2024, 1:25 PM

## 2024-07-22 NOTE — Plan of Care (Signed)
   Problem: Education: Goal: Knowledge of [place] [facility] Education information/materials will improve Outcome: Progressing

## 2024-07-22 NOTE — Plan of Care (Deleted)
  Problem: Education: Goal: Knowledge of [place] [facility] Education information/materials will improve 07/22/2024 0845 by Charlyn Shanda BIRCH, RN Outcome: Progressing 07/22/2024 0843 by Charlyn Shanda BIRCH, RN Outcome: Progressing Goal: Emotional status will improve 07/22/2024 0845 by Charlyn Shanda BIRCH, RN Outcome: Progressing 07/22/2024 0843 by Charlyn Shanda BIRCH, RN Outcome: Progressing Goal: Mental status will improve 07/22/2024 0845 by Charlyn Shanda BIRCH, RN Outcome: Progressing 07/22/2024 0843 by Charlyn Shanda BIRCH, RN Outcome: Progressing Goal: Verbalization of understanding the information provided will improve 07/22/2024 0845 by Charlyn Shanda BIRCH, RN Outcome: Progressing 07/22/2024 0843 by Charlyn Shanda BIRCH, RN Outcome: Progressing

## 2024-07-22 NOTE — Group Note (Signed)
 Date:  07/22/2024 Time:  11:03 AM  Group Topic/Focus:  Wellness    Participation Level:  Did Not Attend   William Allison 07/22/2024, 11:03 AM

## 2024-07-23 MED ORDER — ARIPIPRAZOLE 2 MG PO TABS
2.0000 mg | ORAL_TABLET | Freq: Every day | ORAL | Status: DC
  Administered 2024-07-23 – 2024-07-24 (×2): 2 mg via ORAL
  Filled 2024-07-23 (×2): qty 1

## 2024-07-23 NOTE — Group Note (Signed)
 Date:  07/23/2024 Time:  4:14 PM  Group Topic/Focus:  Developing a Wellness Toolbox:   The focus of this group is to help patients develop a wellness toolbox with skills and strategies to promote recovery upon discharge.    Participation Level:  Active  Participation Quality:  Appropriate  Affect:  Appropriate  Cognitive:  Appropriate  Insight: Appropriate  Engagement in Group:  Engaged  Modes of Intervention:  Discussion   Annalee  Doll Frazee 07/23/2024, 4:14 PM

## 2024-07-23 NOTE — Progress Notes (Signed)
 Patient denies SI, AH, VH. Patient stated they slept good last night. Scored zero on anxiety and depression. Patient has been calm, cooperative, and med compliant.    Mercie VEAR Banana, RN 8:38 AM    07/23/24 0800  Psych Admission Type (Psych Patients Only)  Admission Status Voluntary  Psychosocial Assessment  Patient Complaints None  Eye Contact Fair  Facial Expression Animated  Affect Appropriate to circumstance  Speech Logical/coherent  Interaction Assertive  Motor Activity Fidgety  Appearance/Hygiene Unremarkable  Behavior Characteristics Cooperative;Appropriate to situation  Mood Pleasant  Thought Process  Coherency WDL  Content WDL  Delusions None reported or observed  Perception WDL  Hallucination None reported or observed  Judgment Impaired  Confusion None  Danger to Self  Current suicidal ideation? Denies  Agreement Not to Harm Self Yes  Description of Agreement Verbal  Danger to Others  Danger to Others None reported or observed

## 2024-07-23 NOTE — Group Note (Signed)
 Date:  07/23/2024 Time:  8:39 PM  Group Topic/Focus:  Wrap-Up Group:   The focus of this group is to help patients review their daily goal of treatment and discuss progress on daily workbooks.    Participation Level:  Did Not Attend  Participation Quality:  N/A  Affect:  N/A  Cognitive:  N/A  Insight: None  Engagement in Group:  N/A  Modes of Intervention:  N/A  Additional Comments:  Patient did not attend  Eward Mace 07/23/2024, 8:39 PM

## 2024-07-23 NOTE — Plan of Care (Signed)
   Problem: Education: Goal: Knowledge of [place] [facility] Education information/materials will improve Outcome: Progressing Goal: Emotional status will improve Outcome: Progressing Goal: Mental status will improve Outcome: Progressing Goal: Verbalization of understanding the information provided will improve Outcome: Progressing   Problem: Activity: Goal: Interest or engagement in activities will improve Outcome: Progressing

## 2024-07-23 NOTE — Progress Notes (Signed)
 [facility] MD Progress Note 07/23/2024 8:08 AM GRIER VU  MRN:  [id number] Principal Problem: Alcohol dependence with alcohol-induced mood disorder (HCC) Diagnosis: Principal Problem:   Alcohol dependence with alcohol-induced mood disorder (HCC) Active Problems:   Cocaine use disorder, mild, abuse (HCC)   Anxiety   Subjective:   William Allison is a 10 male with a Hx of polysubstance abuse who presents for anxiety and detox.  Chart Review from last 24 hours and discussion during bed progression: The patient's chart was reviewed and nursing notes were reviewed. The patient's case was discussed in multidisciplinary team meeting.   - events per chart review / staff report: none - Patient received all scheduled medications  Per Patient:   On assessment, the patient was calm, friendly and interacted well with me.  He denies SI, HI and AVH.  Sleep and appetite are both stable.  He denies any symptoms of physiological or psychological withdrawal.  He states the Librium is keeping his withdrawal was very stable and he has no discomfort.  Vital signs are stable.  CIWA scores are 2 and 3. The patient received Celexa per our discussion from yesterday and he reports no problems with it.  After further investigation into his history, the patient endorses a family history of bipolar disorder in his mother and a distinct period of excessive energy characterized by reduced need for sleep, increased goal-directed activity and racing thoughts in his early 55s that were frequent over the course of 2 years.  After discussion with the patient, my concern is there may be a mild bipolar disorder.  The patient also states that he drinks alcohol and takes benzodiazepines to ease his mind.  I informed him that we will discontinue Celexa and will replace with Abilify given its mood stabilizing properties.  We discussed risk, benefits and side effects to include increased blood sugar, weight gain, cholesterol and heart  conduction changes.  The patient understands and agrees.  Patient is interested in a residential substance abuse program but knows that he does not have insurance and is willing to do a outpatient program.  The patient states that he drinks alcohol to drown away his feelings of failure.  The patient states that he used to have a very full life but now it is consumed with substance abuse.  Sleep: Good Appetite:  Good Depression: mild Anxiety: mild Auditory Hallucinations: neg Visual Hallucinations: neg Paranoia: neg HI: neg SI: neg Side effects from medications: does not endorse any side-effects they attribute to medications. Other concerns discussed with patient:  ROS  Total time spent with patient: 35 minutes   Past Medical History (auto-populated):  Past Medical History:  Diagnosis Date   ADD (attention deficit disorder)    Constipation 05/30/12   [facility], Belvie Just; Donnatal q.i.d. Follow up in 4 weeks.   GERD (gastroesophageal reflux disease)     Past Surgical History:  Procedure Laterality Date   HAND SURGERY     for fracture   LAPAROSCOPIC APPENDECTOMY N/A 12/26/2016   Procedure: APPENDECTOMY LAPAROSCOPIC;  Surgeon: Morene Olives, MD;  Location: [facility];  Service: General;  Laterality: N/A;   TYMPANOSTOMY TUBE PLACEMENT      Social History:  Social History   Substance and Sexual Activity  Alcohol Use Yes   Alcohol/week: 24.0 standard drinks of alcohol   Types: 24 Cans of beer per week   Comment: drinks a case of beer a day     Social History   Substance and  Sexual Activity  Drug Use Yes   Types: Marijuana, Cocaine, Methamphetamines    Social History   Socioeconomic History   Marital status: Single    Spouse name: Not on file   Number of children: Not on file   Years of education: Not on file   Highest education level: Not on file  Occupational History   Not on file  Tobacco Use   Smoking status: Every Day    Current packs/day:  0.50    Average packs/day: 0.5 packs/day for 0.8 years (0.4 ttl pk-yrs)    Types: Cigarettes    Start date: 2025   Smokeless tobacco: Never  Vaping Use   Vaping status: Never Used  Substance and Sexual Activity   Alcohol use: Yes    Alcohol/week: 24.0 standard drinks of alcohol    Types: 24 Cans of beer per week    Comment: drinks a case of beer a day   Drug use: Yes    Types: Marijuana, Cocaine, Methamphetamines   Sexual activity: Yes  Other Topics Concern   Not on file  Social History Narrative   Not on file   Social Drivers of Health   Financial Resource Strain: Not on file  Food Insecurity: Food Insecurity Present (07/21/2024)   Hunger Vital Sign    Worried About Running Out of Food in the Last Year: Often true    Ran Out of Food in the Last Year: Often true  Transportation Needs: Unmet Transportation Needs (07/21/2024)   PRAPARE - Administrator, Civil Service (Medical): Yes    Lack of Transportation (Non-Medical): Yes  Physical Activity: Not on file  Stress: Not on file  Social Connections: Not on file   Additional Social History:   -Homeless - Jobless - Legal problems and on probation - Previous history of incarceration  Objective: Medications, Labs, Mental Status Exam, Physical Exam Current Medications: Current Facility-Administered Medications  Medication Dose Route Frequency Provider Last Rate Last Admin   acetaminophen  (TYLENOL ) tablet 650 mg  650 mg Oral Q6H PRN White, Patrice L, NP   650 mg at 07/21/24 1527   alum & mag hydroxide-simeth (MAALOX/MYLANTA) 200-200-20 MG/5ML suspension 30 mL  30 mL Oral Q4H PRN White, Patrice L, NP       chlordiazePOXIDE (LIBRIUM) capsule 25 mg  25 mg Oral Q6H PRN White, Patrice L, NP       chlordiazePOXIDE (LIBRIUM) capsule 25 mg  25 mg Oral BH-qamhs White, Patrice L, NP       Followed by   NOREEN ON 07/25/2024] chlordiazePOXIDE (LIBRIUM) capsule 25 mg  25 mg Oral Daily White, Patrice L, NP       citalopram  (CELEXA) tablet 10 mg  10 mg Oral Daily Chandra Charleston Christian Lee, DO   10 mg at 07/23/24 9264   haloperidol  (HALDOL ) tablet 5 mg  5 mg Oral TID PRN White, Patrice L, NP       And   diphenhydrAMINE  (BENADRYL ) capsule 50 mg  50 mg Oral TID PRN White, Patrice L, NP       haloperidol  lactate (HALDOL ) injection 5 mg  5 mg Intramuscular TID PRN White, Patrice L, NP       And   diphenhydrAMINE  (BENADRYL ) injection 50 mg  50 mg Intramuscular TID PRN White, Patrice L, NP       And   LORazepam  (ATIVAN ) injection 2 mg  2 mg Intramuscular TID PRN White, Patrice L, NP       haloperidol   lactate (HALDOL ) injection 10 mg  10 mg Intramuscular TID PRN White, Patrice L, NP       And   diphenhydrAMINE  (BENADRYL ) injection 50 mg  50 mg Intramuscular TID PRN White, Patrice L, NP       And   LORazepam  (ATIVAN ) injection 2 mg  2 mg Intramuscular TID PRN White, Patrice L, NP       hydrOXYzine  (ATARAX ) tablet 25 mg  25 mg Oral TID PRN White, Patrice L, NP   25 mg at 07/22/24 2111   loperamide  (IMODIUM ) capsule 2-4 mg  2-4 mg Oral PRN White, Patrice L, NP       magnesium  hydroxide (MILK OF MAGNESIA) suspension 30 mL  30 mL Oral Daily PRN White, Patrice L, NP       multivitamin with minerals tablet 1 tablet  1 tablet Oral Daily White, Patrice L, NP   1 tablet at 07/23/24 0735   nicotine  polacrilex (NICORETTE ) gum 2 mg  2 mg Oral PRN Prentis Kitchens A, DO   2 mg at 07/22/24 2117   ondansetron  (ZOFRAN -ODT) disintegrating tablet 4 mg  4 mg Oral Q6H PRN White, Patrice L, NP       thiamine  (Vitamin B-1) tablet 100 mg  100 mg Oral Daily White, Patrice L, NP   100 mg at 07/23/24 0735   traZODone  (DESYREL ) tablet 50 mg  50 mg Oral QHS PRN White, Patrice L, NP   50 mg at 07/22/24 2111    Lab Results:  No results found for this or any previous visit (from the past 48 hours).   Blood Alcohol level:  Lab Results  Component Value Date   ETH 72 (H) 07/21/2024   ETH 51 (H) 07/19/2024    Metabolic Disorder Labs: No  results found for: HGBA1C, MPG No results found for: PROLACTIN No results found for: CHOL, TRIG, HDL, CHOLHDL, VLDL, LDLCALC  Physical Findings: AIMS:  , ,  ,  ,    CIWA:  CIWA-Ar Total: 3 COWS:     Musculoskeletal: Strength & Muscle Tone: within normal limits Gait & Station: normal Patient leans: N/A  Mental Status Exam: General Appearance: Disheveled  Orientation:  Full (Time, Place, and Person)  Memory:  Immediate;   Good Remote;   Good  Concentration:  Concentration: Good  Recall:  Good  Attention  Good  Eye Contact:  Good  Speech:  Clear and Coherent  Language:  Good  Volume:  Normal  Mood:  Pretty Good   Affect:  Full Range  Thought Process:  Coherent  Thought Content:  Logical  Suicidal Thoughts:  No  Homicidal Thoughts:  No  Judgement:  Fair  Insight:  Fair  Psychomotor Activity:  Normal  Akathisia:  No  Fund of Knowledge:  Fair      Assets:  Physical Health  Cognition:  WNL  ADL's:  Intact   Physical Exam: Physical Exam  Blood pressure (!) 128/106, pulse 85, temperature (!) 97.5 F (36.4 C), temperature source Oral, resp. rate 18, height 6' 4 (1.93 m), weight 86.8 kg, SpO2 100%. Body mass index is 23.3 kg/m.  ASSESSMENT:  Diagnoses / Active Problems: Diagnosis:  Principal Problem:   Alcohol dependence with alcohol-induced mood disorder (HCC) Active Problems:   Cocaine use disorder, mild, abuse (HCC)   Anxiety  William Allison is a 33 y/o male, currently homeless, with a history of reported ADHD and anxiety, and a long history of heavy alcohol use and frequent to daily cocaine use  who presented voluntarily for detoxification. His UDS was positive for amphetamines and benzodiazepines, which is consistent with his PDMP report. He presents still acutely intoxicated, with a BAL this morning of 72. He is at high risk for complicated withdrawal, and was started on CIWA protocol and scheduled librium taper upon admission.    07/22/2024 -mood, affect and behavior are stable. Comfortable and withdrawal is mild, CIWA 4,5. VSS. Discussed starting Celexa with patient regarding mood, though is reluctant to start any meds. Not interested in stopping BDZ or stimulants.  07/23/2024 - Mood and affect are stable.  Withdrawal continues to be mild, CIWA scores 2, 3.  Vital signs stable.  We will discontinue Celexa after discussion about the patient's family history and remote history.  Notably, patient reports mother has a diagnosis of bipolar disorder and the patient reports several weeks over the course of 2 years of discrete episodes of increased goal-directed activity, sleeplessness and increased energy as well as impulsiveness would last several weeks at a time followed by some depressed mood.  These occurred in the absence of excessive caffeine or substance abuse. In place of Celexa, we will initiate Abilify for mood stabilization.   PLAN: Safety and Monitoring:  -- Voluntary admission to inpatient psychiatric unit for safety, stabilization and treatment  -- Daily contact with patient to assess and evaluate symptoms and progress in treatment  -- Patient's case to be discussed in multi-disciplinary team meeting  -- Observation Level : q15 minute checks  -- Vital signs:  q12 hours  -- Precautions: suicide, elopement, and assault  2. Psychiatric Diagnoses and Treatment:  - Start Abilify 2 mg for mood stabilization - Discontinue Celexa Celexa to family history of bipolar disorder and his reported periods of elevated mood, increased goal-directed activity and absence of substance use - CIWA with librium prn - Librium taper: 100 mg day 1, 75 mg day 2, 50 mg day 3, then 25 mg day 4 then stop  The risks/benefits/side-effects/alternatives to this medication were discussed in detail with the patient and time was given for questions. The patient consents to medication trial.  Metabolic profile and EKG monitoring obtained while  on an atypical antipsychotic  Encouraged patient to participate in unit milieu and in scheduled group therapies   -- Short Term Goals: Ability to verbalize feelings will improve, Ability to identify and develop effective coping behaviors will improve, and Ability to maintain clinical measurements within normal limits will improve  -- Long Term Goals: Improvement in symptoms so as ready for discharge    3. Medical Issues Being Addressed:   Substance abuse and withdrawal  Labs reviewed,  UDS positive for amphetamines, benzos, THC, and cocaine. Etoh level 72 this morning at 7am.  - QTC: - Ordering hemoglobin A1c and lipid panel for antipsychotic monitoring   Tobacco Use Disorder  --  Patient in need of nicotine  replacement; nicotine  polacrilex (gum) ordered. Smoking cessation encouraged  -- Smoking cessation encouraged  4. Discharge Planning:   -- Social work and case management to assist with discharge planning and identification of hospital follow-up needs prior to discharge  -- Estimated discharge: 5 days, patient needs a outpatient substance abuse program or residential substance abuse  -- Discharge Concerns: Need to establish a safety plan; Medication compliance and effectiveness  -- Discharge Goals: Return home with outpatient referrals for mental health follow-up including medication management/psychotherapy   Lamar Sherlean Jama Chandra, DO 07/23/2024, 8:08 AM

## 2024-07-23 NOTE — Group Note (Signed)
 Recreation Therapy Group Note   Group Topic:Animal Assisted Therapy   Group Date: 07/23/2024 Start Time: 9052 End Time: 1030 Facilitators: Demetrias Goodbar-McCall, LRT,CTRS Location: [address] Dayroom   Animal-Assisted Activity (AAA) Program Checklist/Progress Notes Patient Eligibility Criteria Checklist & Daily Group note for Rec Tx Intervention  AAA/T Program Assumption of Risk Form signed by Patient/ or Parent Legal Guardian Yes  Patient understands his/her participation is voluntary Yes  Behavioral Response:    Education: Charity fundraiser, Appropriate Animal Interaction   Education Outcome: Acknowledges education.    Affect/Mood: N/A   Participation Level: Did not attend    Clinical Observations/Individualized Feedback:     Plan: Continue to engage patient in RT group sessions 2-3x/week.   William Allison, LRT,CTRS 07/23/2024 12:06 PM

## 2024-07-23 NOTE — Group Note (Signed)
 LCSW Group Therapy Note   Group Date: 07/23/2024 Start Time: 1300 End Time: 1350  Type of Therapy and Topic:  Group Therapy: Building Healthy Relationships  Participation Level:  Did Not Attend  Description of Group: This group will address the use of boundaries in their personal lives. Patients will explore why boundaries are important, the difference between healthy and unhealthy boundaries, and negative and postive outcomes of different boundaries and will look at how boundaries can be crossed.  Patients will be encouraged to identify current boundaries in their own lives and identify what kind of boundary is being set. Facilitators will guide patients in utilizing problem-solving interventions to address and correct types boundaries being used and to address when no boundary is being used. Understanding and applying boundaries will be explored and addressed for obtaining and maintaining a balanced life. Patients will be encouraged to explore ways to assertively make their boundaries and needs known to significant others in their lives, using other group members and facilitator for role play, support, and feedback. Objective:  To explore loneliness, boundaries, and safe ways to build relationships. Participants discussed loneliness, healthy connections, and setting boundaries. They explored safe ways to meet people and shared personal experiences. Key insights were reinforced through discussion and quotes.   Goals: Recognize healthy vs. unhealthy relationships. Learn safe ways to connect with others. Strengthen communication and [employer].    Therapeutic Modalities Used: Cognitive Behavioral Therapy (CBT) Elements - Identifying unhealthy relationship patterns, challenging negative thoughts about connection. Dialectical Behavior Therapy (DBT) Elements - Interpersonal effectiveness, setting and maintaining boundaries. Supportive Group Therapy - Peer discussion, shared  experiences, and emotional validation.  Summary of Patient Progress:  Patient did not attend.  Louetta Lame, [place] 07/23/2024  2:43 PM

## 2024-07-23 NOTE — Progress Notes (Signed)
   07/22/24 2300  Psych Admission Type (Psych Patients Only)  Admission Status Voluntary  Psychosocial Assessment  Patient Complaints Substance abuse  Eye Contact Fair  Facial Expression Animated  Affect Appropriate to circumstance  Speech Logical/coherent  Interaction Assertive  Motor Activity Fidgety  Appearance/Hygiene Disheveled  Behavior Characteristics Cooperative  Mood Pleasant  Thought Process  Coherency WDL  Content WDL  Delusions None reported or observed  Perception WDL  Hallucination None reported or observed  Judgment Impaired  Confusion None  Danger to Self  Current suicidal ideation? Denies  Agreement Not to Harm Self Yes  Description of Agreement Verbal  Danger to Others  Danger to Others None reported or observed

## 2024-07-23 NOTE — [facility] Counselor (Signed)
 CSW informed patient about [facility] acceptance and upcoming appointment on Friday at 8:30am. Patient agreed to go and reported this is something he's looking forward to completing. Treatment team to be made aware.    Genny Caulder, LCSWA 07/23/24 1:58PM.

## 2024-07-23 NOTE — Group Note (Signed)
 Date:  07/23/2024 Time:  1:29 PM  Group Topic/Focus:  [facility] [facility] Group:   The purpose of this group is to discuss setting SMART goals and creating a WRAP plan.   Participation Level:  Minimal  Participation Quality:  Appropriate  Affect:  Appropriate  Cognitive:  Appropriate  Insight: Appropriate  Engagement in Group:  Developing/Improving  Modes of Intervention:  Discussion and Education  Additional Comments:    Cruz JONETTA Mars 07/23/2024, 1:29 PM

## 2024-07-23 NOTE — Group Note (Signed)
 Date:  07/23/2024 Time:  10:05 AM  Group Topic/Focus:  Goals Group:   The focus of this group is to help patients establish daily goals to achieve during treatment and discuss how the patient can incorporate goal setting into their daily lives to aide in recovery. Orientation:   The focus of this group is to educate the patient on the purpose and policies of crisis stabilization and provide a format to answer questions about their admission.  The group details unit policies and expectations of patients while admitted.    Participation Level:  Did Not Attend    William Allison Mars 07/23/2024, 10:05 AM

## 2024-07-23 NOTE — Group Note (Signed)
 Date:  07/23/2024 Time:  11:30 AM  Group Topic/Focus:  Pet Therapy:   This group aims to reduce stress and anxiety, improve mood, and increase feelings of comfort using animals.   Participation Level:  Did Not Attend   William Allison Mars 07/23/2024, 11:30 AM

## 2024-07-23 NOTE — Plan of Care (Signed)
   Problem: Education: Goal: Emotional status will improve Outcome: Progressing Goal: Mental status will improve Outcome: Progressing   Problem: Activity: Goal: Sleeping patterns will improve Outcome: Progressing

## 2024-07-23 NOTE — Progress Notes (Signed)
(  Sleep Hours) - 8  (Any PRNs that were needed, meds refused, or side effects to meds)- atarax  49m, trazodone  50 mg, no meds refused, no side effects to meds  (Any disturbances and when (visitation, over night)- n/a  (Concerns raised by the patient)- n/a  (SI/HI/AVH)- denies

## 2024-07-24 MED ORDER — ARIPIPRAZOLE 2 MG PO TABS
2.0000 mg | ORAL_TABLET | Freq: Every day | ORAL | Status: DC
  Administered 2024-07-25: 2 mg via ORAL
  Filled 2024-07-24: qty 1
  Filled 2024-07-24: qty 10

## 2024-07-24 NOTE — Progress Notes (Signed)
   07/24/24 2000  Psych Admission Type (Psych Patients Only)  Admission Status Voluntary  Psychosocial Assessment  Patient Complaints None  Eye Contact Fair  Facial Expression Animated  Affect Appropriate to circumstance  Speech Logical/coherent  Interaction Assertive  Motor Activity Fidgety;Restless  Appearance/Hygiene Unremarkable  Behavior Characteristics Cooperative  Mood Pleasant  Thought Process  Coherency WDL  Content WDL  Delusions None reported or observed  Perception WDL  Hallucination None reported or observed  Judgment Impaired  Confusion None  Danger to Self  Current suicidal ideation? Denies  Agreement Not to Harm Self Yes  Description of Agreement Verbal  Danger to Others  Danger to Others None reported or observed

## 2024-07-24 NOTE — Group Note (Signed)
 Recreation Therapy Group Note   Group Topic:Leisure Education  Group Date: 07/24/2024 Start Time: 0933 End Time: 1020 Facilitators: Khloie Hamada-McCall, LRT,CTRS Location: [address] Dayroom   Group Topic: Leisure Education  Goal Area(s) Addresses:  Patient will identify positive leisure activities for use post discharge. Patient will identify at least one positive benefit of participation in leisure activities.  Patient will work effectively work with peer by sharing ideas and contributing to Social worker.  Behavioral Response:    Intervention: Innovation, Group Presentation   Activity: In pairs, patients were asked to create a community based program with their teammate. Team's were tasked with designing a program, including a Name, Demographic, Age group, Benefits, Hours of operation and What is offered.  Education:  Leisure Scientist, physiological, Special educational needs teacher, Teamwork, Discharge Planning  Education Outcome: Acknowledges education/In group clarification offered/Needs additional education.    Affect/Mood: N/A   Participation Level: Did not attend    Clinical Observations/Individualized Feedback:      Plan: Continue to engage patient in RT group sessions 2-3x/week.   Danine Hor-McCall, LRT,CTRS 07/24/2024 11:35 AM

## 2024-07-24 NOTE — Group Note (Signed)
 Date:  07/24/2024 Time:  3:53 PM  Group Topic/Focus: Spiritual Wellness  Pt did not attend spiritual wellness group  William Allison 07/24/2024, 3:53 PM

## 2024-07-24 NOTE — Progress Notes (Signed)

## 2024-07-24 NOTE — Progress Notes (Signed)
 [facility] MD Progress Note 07/24/2024 8:03 AM William Allison  MRN:  [id number] Principal Problem: Bipolar disorder, unspecified (HCC) Diagnosis: Principal Problem:   Bipolar disorder, unspecified (HCC) Active Problems:   Methamphetamine use disorder, mild, abuse (HCC)   Cannabis use disorder, severe, dependence (HCC)   Polysubstance abuse (HCC)   Cocaine use disorder, mild, abuse (HCC)   Alcohol dependence with alcohol-induced mood disorder (HCC)   Anxiety   Subjective:   William Allison is a 33 male with a Hx of polysubstance abuse who presents for anxiety and detox.  Chart Review from last 24 hours and discussion during bed progression: The patient's chart was reviewed and nursing notes were reviewed. The patient's case was discussed in multidisciplinary team meeting.   - events per chart review / staff report: none - Patient received all scheduled medications  Per Patient:   On assessment the patient is calm, friendly, interacts well with me.  He reports his mood is good and affect is euthymic.  Sleep and appetite are stable.  The patient is attending groups and getting along well with staff, fellow patients and myself. He appears comfortable and vital signs are stable.  CIWA scores are 2 and he informs me of no symptoms of withdrawal. The patient states that his first dose of Abilify made him feel a little unsteady and we discussed moving this to the evening time and he agrees.  He agrees to continued use of this medicine. Denies SI, HI and AVH.  Thought process and thought content are within normal limits.  We discussed discharge on Friday to the since abuse intensive outpatient program which he looks forward to as he states that he needs to make a serious effort to eliminating alcohol and other substances from his life.  Sleep: Good Appetite:  Good Depression: mild Anxiety: mild Auditory Hallucinations: neg Visual Hallucinations: neg Paranoia: neg HI: neg SI: neg Side effects  from medications: does not endorse any side-effects they attribute to medications. Other concerns discussed with patient:  ROS  Total time spent with patient: 35 minutes   Past Medical History (auto-populated):  Past Medical History:  Diagnosis Date   ADD (attention deficit disorder)    Constipation 05/30/12   [facility], Belvie Just; Donnatal q.i.d. Follow up in 4 weeks.   GERD (gastroesophageal reflux disease)     Past Surgical History:  Procedure Laterality Date   HAND SURGERY     for fracture   LAPAROSCOPIC APPENDECTOMY N/A 12/26/2016   Procedure: APPENDECTOMY LAPAROSCOPIC;  Surgeon: Morene Olives, MD;  Location: [facility];  Service: General;  Laterality: N/A;   TYMPANOSTOMY TUBE PLACEMENT      Social History:  Social History   Substance and Sexual Activity  Alcohol Use Yes   Alcohol/week: 24.0 standard drinks of alcohol   Types: 24 Cans of beer per week   Comment: drinks a case of beer a day     Social History   Substance and Sexual Activity  Drug Use Yes   Types: Marijuana, Cocaine, Methamphetamines    Social History   Socioeconomic History   Marital status: Single    Spouse name: Not on file   Number of children: Not on file   Years of education: Not on file   Highest education level: Not on file  Occupational History   Not on file  Tobacco Use   Smoking status: Every Day    Current packs/day: 0.50    Average packs/day: 0.5 packs/day for 0.8 years (0.4  ttl pk-yrs)    Types: Cigarettes    Start date: 2025   Smokeless tobacco: Never  Vaping Use   Vaping status: Never Used  Substance and Sexual Activity   Alcohol use: Yes    Alcohol/week: 24.0 standard drinks of alcohol    Types: 24 Cans of beer per week    Comment: drinks a case of beer a day   Drug use: Yes    Types: Marijuana, Cocaine, Methamphetamines   Sexual activity: Yes  Other Topics Concern   Not on file  Social History Narrative   Not on file   Social Drivers of Health    Financial Resource Strain: Not on file  Food Insecurity: Food Insecurity Present (07/21/2024)   Hunger Vital Sign    Worried About Programme researcher, broadcasting/film/video in the Last Year: Often true    Ran Out of Food in the Last Year: Often true  Transportation Needs: Unmet Transportation Needs (07/21/2024)   PRAPARE - Administrator, Civil Service (Medical): Yes    Lack of Transportation (Non-Medical): Yes  Physical Activity: Not on file  Stress: Not on file  Social Connections: Not on file   Additional Social History:   -Homeless - Jobless - Legal problems and on probation - Previous history of incarceration  Objective: Medications, Labs, Mental Status Exam, Physical Exam Current Medications: Current Facility-Administered Medications  Medication Dose Route Frequency Provider Last Rate Last Admin   acetaminophen  (TYLENOL ) tablet 650 mg  650 mg Oral Q6H PRN White, Patrice L, NP   650 mg at 07/21/24 1527   alum & mag hydroxide-simeth (MAALOX/MYLANTA) 200-200-20 MG/5ML suspension 30 mL  30 mL Oral Q4H PRN White, Patrice L, NP       ARIPiprazole (ABILIFY) tablet 2 mg  2 mg Oral Daily Chandra Charleston Christian Lee, DO   2 mg at 07/23/24 1258   chlordiazePOXIDE (LIBRIUM) capsule 25 mg  25 mg Oral Q6H PRN White, Patrice L, NP       chlordiazePOXIDE (LIBRIUM) capsule 25 mg  25 mg Oral BH-qamhs White, Patrice L, NP   25 mg at 07/23/24 2054   Followed by   NOREEN ON 07/25/2024] chlordiazePOXIDE (LIBRIUM) capsule 25 mg  25 mg Oral Daily White, Patrice L, NP       haloperidol  (HALDOL ) tablet 5 mg  5 mg Oral TID PRN White, Patrice L, NP       And   diphenhydrAMINE  (BENADRYL ) capsule 50 mg  50 mg Oral TID PRN White, Patrice L, NP       haloperidol  lactate (HALDOL ) injection 5 mg  5 mg Intramuscular TID PRN White, Patrice L, NP       And   diphenhydrAMINE  (BENADRYL ) injection 50 mg  50 mg Intramuscular TID PRN White, Patrice L, NP       And   LORazepam  (ATIVAN ) injection 2 mg  2 mg Intramuscular  TID PRN White, Patrice L, NP       haloperidol  lactate (HALDOL ) injection 10 mg  10 mg Intramuscular TID PRN White, Patrice L, NP       And   diphenhydrAMINE  (BENADRYL ) injection 50 mg  50 mg Intramuscular TID PRN White, Patrice L, NP       And   LORazepam  (ATIVAN ) injection 2 mg  2 mg Intramuscular TID PRN White, Patrice L, NP       hydrOXYzine  (ATARAX ) tablet 25 mg  25 mg Oral TID PRN White, Patrice L, NP   25 mg at  07/23/24 2053   loperamide  (IMODIUM ) capsule 2-4 mg  2-4 mg Oral PRN White, Patrice L, NP       magnesium  hydroxide (MILK OF MAGNESIA) suspension 30 mL  30 mL Oral Daily PRN White, Patrice L, NP       multivitamin with minerals tablet 1 tablet  1 tablet Oral Daily White, Patrice L, NP   1 tablet at 07/23/24 0735   nicotine  polacrilex (NICORETTE ) gum 2 mg  2 mg Oral PRN Prentis Kitchens A, DO   2 mg at 07/23/24 2136   ondansetron  (ZOFRAN -ODT) disintegrating tablet 4 mg  4 mg Oral Q6H PRN White, Patrice L, NP       thiamine  (Vitamin B-1) tablet 100 mg  100 mg Oral Daily White, Patrice L, NP   100 mg at 07/23/24 0735   traZODone  (DESYREL ) tablet 50 mg  50 mg Oral QHS PRN White, Patrice L, NP   50 mg at 07/23/24 2053    Lab Results:  No results found for this or any previous visit (from the past 48 hours).   Blood Alcohol level:  Lab Results  Component Value Date   ETH 72 (H) 07/21/2024   ETH 51 (H) 07/19/2024    Metabolic Disorder Labs: No results found for: HGBA1C, MPG No results found for: PROLACTIN No results found for: CHOL, TRIG, HDL, CHOLHDL, VLDL, LDLCALC  Physical Findings: AIMS:  , ,  ,  ,    CIWA:  CIWA-Ar Total: 2 COWS:     Musculoskeletal: Strength & Muscle Tone: within normal limits Gait & Station: normal Patient leans: N/A  Mental Status Exam: General Appearance: Disheveled  Orientation:  Full (Time, Place, and Person)  Memory:  Immediate;   Good Remote;   Good  Concentration:  Concentration: Good  Recall:  Good  Attention   Good  Eye Contact:  Good  Speech:  Clear and Coherent  Language:  Good  Volume:  Normal  Mood:  Pretty Good   Affect:  Full Range  Thought Process:  Coherent  Thought Content:  Logical  Suicidal Thoughts:  No  Homicidal Thoughts:  No  Judgement:  Fair  Insight:  Fair  Psychomotor Activity:  Normal  Akathisia:  No  Fund of Knowledge:  Fair      Assets:  Physical Health  Cognition:  WNL  ADL's:  Intact   Physical Exam: Physical Exam  Blood pressure (!) 115/99, pulse 87, temperature 98 F (36.7 C), temperature source Oral, resp. rate 16, height 6' 4 (1.93 m), weight 86.8 kg, SpO2 100%. Body mass index is 23.3 kg/m.  ASSESSMENT:  Diagnoses / Active Problems: Diagnosis:  Principal Problem:   Alcohol dependence with alcohol-induced mood disorder (HCC) Active Problems:   Cocaine use disorder, mild, abuse (HCC)   Anxiety  William Allison is a 33 y/o male, currently homeless, with a history of reported ADHD and anxiety, and a long history of heavy alcohol use and frequent to daily cocaine use who presented voluntarily for detoxification. His UDS was positive for amphetamines and benzodiazepines, which is consistent with his PDMP report. He presents still acutely intoxicated, with a BAL this morning of 72. He is at high risk for complicated withdrawal, and was started on CIWA protocol and scheduled librium taper upon admission.   07/22/2024 -mood, affect and behavior are stable. Comfortable and withdrawal is mild, CIWA 4,5. VSS. Discussed starting Celexa with patient regarding mood, though is reluctant to start any meds. Not interested in stopping BDZ or stimulants.  07/23/2024 - Mood and affect are stable.  Withdrawal continues to be mild, CIWA scores 2, 3.  Vital signs stable.  We will discontinue Celexa after discussion about the patient's family history and remote history.  Notably, patient reports mother has a diagnosis of bipolar disorder and the patient reports several  weeks over the course of 2 years of discrete episodes of increased goal-directed activity, sleeplessness and increased energy as well as impulsiveness would last several weeks at a time followed by some depressed mood.  These occurred in the absence of excessive caffeine or substance abuse. In place of Celexa, we will initiate Abilify for mood stabilization.  10/22 - Continued stability in terms of mood and affect.  Withdrawal is stable and CIWA score is 2 with able vital signs.  I will move Abilify to the evening given patient's reports of feeling uneasy with it. - Discharge plan will be for Friday 10/24 to the substance abuse intensive outpatient program.  The patient agrees with this plan.   PLAN: Safety and Monitoring:  -- Voluntary admission to inpatient psychiatric unit for safety, stabilization and treatment  -- Daily contact with patient to assess and evaluate symptoms and progress in treatment  -- Patient's case to be discussed in multi-disciplinary team meeting  -- Observation Level : q15 minute checks  -- Vital signs:  q12 hours  -- Precautions: suicide, elopement, and assault  2. Psychiatric Diagnoses and Treatment:  - Continue Abilify 2 mg for mood stabilization [facility] - Discontinue Celexa Celexa to family history of bipolar disorder and his reported periods of elevated mood, increased goal-directed activity and absence of substance use - CIWA with librium prn - Librium taper: 100 mg day 1, 75 mg day 2, 50 mg day 3, then 25 mg day 4 then stop  The risks/benefits/side-effects/alternatives to this medication were discussed in detail with the patient and time was given for questions. The patient consents to medication trial.  Metabolic profile and EKG monitoring obtained while on an atypical antipsychotic  Encouraged patient to participate in unit milieu and in scheduled group therapies   -- Short Term Goals: Ability to verbalize feelings will improve, Ability to identify and  develop effective coping behaviors will improve, and Ability to maintain clinical measurements within normal limits will improve  -- Long Term Goals: Improvement in symptoms so as ready for discharge    3. Medical Issues Being Addressed:   Substance abuse and withdrawal  Labs reviewed,  UDS positive for amphetamines, benzos, THC, and cocaine. Etoh level 72 this morning at 7am.  - QTC: - Ordering hemoglobin A1c and lipid panel for antipsychotic monitoring   Tobacco Use Disorder  --  Patient in need of nicotine  replacement; nicotine  polacrilex (gum) ordered. Smoking cessation encouraged  -- Smoking cessation encouraged  4. Discharge Planning:   -- Social work and case management to assist with discharge planning and identification of hospital follow-up needs prior to discharge  -- Estimated discharge: 5 days, patient needs a outpatient substance abuse program or residential substance abuse  -- Discharge Concerns: Need to establish a safety plan; Medication compliance and effectiveness  -- Discharge Goals: Return home with outpatient referrals for mental health follow-up including medication management/psychotherapy   Lamar Sherlean Jama Chandra, DO 07/24/2024, 8:03 AM

## 2024-07-24 NOTE — Group Note (Signed)
 Date:  07/24/2024 Time:  10:32 AM  Group Topic/Focus: Recreation Therapy   Pt did not attend recreation therapy group.  Anysa Tacey R Buffy Ehler 07/24/2024, 10:32 AM

## 2024-07-24 NOTE — Progress Notes (Signed)
   07/24/24 0806  Psych Admission Type (Psych Patients Only)  Admission Status Voluntary  Psychosocial Assessment  Patient Complaints None  Eye Contact Fair  Facial Expression Animated  Affect Appropriate to circumstance  Speech Logical/coherent  Interaction Assertive  Motor Activity Fidgety;Restless  Appearance/Hygiene Unremarkable  Behavior Characteristics Cooperative  Mood Pleasant  Thought Process  Coherency WDL  Content WDL  Delusions None reported or observed  Perception WDL  Hallucination None reported or observed  Judgment Impaired  Confusion None  Danger to Self  Current suicidal ideation? Denies  Agreement Not to Harm Self Yes  Description of Agreement Verbal

## 2024-07-24 NOTE — Group Note (Signed)
 Date:  07/24/2024 Time:  8:06 PM  Group Topic/Focus:  Wrap-Up Group:   The focus of this group is to help patients review their daily goal of treatment and discuss progress on daily workbooks.    Participation Level:  Active  Participation Quality:  Appropriate  Affect:  Appropriate  Cognitive:  Appropriate  Insight: Appropriate  Engagement in Group:  Engaged  Modes of Intervention:  Discussion  Additional Comments:   Pt attended NA meeting  Kelin Nixon A Kaveri Perras 07/24/2024, 8:06 PM

## 2024-07-24 NOTE — Group Note (Signed)
 Date:  07/24/2024 Time:  9:19 AM  Group Topic/Focus:  Goals Group:   The focus of this group is to help patients establish daily goals to achieve during treatment and discuss how the patient can incorporate goal setting into their daily lives to aide in recovery. Orientation:   The focus of this group is to educate the patient on the purpose and policies of crisis stabilization and provide a format to answer questions about their admission.  The group details unit policies and expectations of patients while admitted.  To create a supportive and motivating environment where members can clarify and set meaningful personal and professional goals, understand how fulfilling foundational needs (as outlined in Maslow's hierarchy) supports their growth, utilize positive affirmations to build self-confidence and resilience, and foster continuous personal development and self-actualization.  Participation Level:  Did Not Attend   Lauris JONELLE Morales 07/24/2024, 9:19 AM

## 2024-07-24 NOTE — Plan of Care (Signed)
   Problem: Education: Goal: Knowledge of [place] [facility] Education information/materials will improve Outcome: Progressing Goal: Emotional status will improve Outcome: Progressing Goal: Mental status will improve Outcome: Progressing Goal: Verbalization of understanding the information provided will improve Outcome: Progressing

## 2024-07-24 NOTE — Group Note (Signed)
 Date:  07/24/2024 Time:  4:59 PM  Group Topic/Focus:  Coping With Mental Health Crisis:   The purpose of this group is to help patients identify strategies for coping with mental health crisis.  Group discusses possible causes of crisis and ways to manage them effectively. Developing a Wellness Toolbox:   The focus of this group is to help patients develop a wellness toolbox with skills and strategies to promote recovery upon discharge.    Participation Level:  Active  Participation Quality:  Appropriate  Affect:  Appropriate  Cognitive:  Appropriate  Insight: Appropriate  Engagement in Group:  Engaged  Modes of Intervention:  Education  Additional Comments:    William Allison 07/24/2024, 4:59 PM

## 2024-07-24 NOTE — [facility] Suicide Risk Assessment (Signed)
 [facility] INPATIENT:  Family/Significant Other Suicide Prevention Education  Suicide Prevention Education:  Contact Attempts: [employer], mom, [phone] , (name of family member/significant other) has been identified by the patient as the family member/significant other with whom the patient will be residing, and identified as the person(s) who will aid the patient in the event of a mental health crisis.  With written consent from the patient, two attempts were made to provide suicide prevention education, prior to and/or following the patient's discharge.  We were unsuccessful in providing suicide prevention education.  A suicide education pamphlet was given to the patient to share with family/significant other.  Date and time of first attempt:07-21-24/11:11am Date and time of second attempt:07-24-24/1:05pm  William Allison, LCSWA 07/24/2024, 1:05 PM

## 2024-07-24 NOTE — Progress Notes (Signed)
(  Sleep Hours) - 8hrs (Any PRNs that were needed, meds refused, or side effects to meds)- hydroxyzine , trazodone  (Any disturbances and when (visitation, over night)- none (Concerns raised by the patient)- none (SI/HI/AVH)- denies

## 2024-07-24 NOTE — Plan of Care (Signed)
   Problem: Education: Goal: Emotional status will improve Outcome: Progressing Goal: Mental status will improve Outcome: Progressing Goal: Verbalization of understanding the information provided will improve Outcome: Progressing

## 2024-07-25 MED ORDER — NALTREXONE HCL 50 MG PO TABS
50.0000 mg | ORAL_TABLET | Freq: Every day | ORAL | Status: DC
  Administered 2024-07-25 – 2024-07-26 (×2): 50 mg via ORAL
  Filled 2024-07-25: qty 1
  Filled 2024-07-25: qty 10
  Filled 2024-07-25: qty 1

## 2024-07-25 NOTE — Group Note (Signed)
 LCSW Group Therapy Note   Group Date: 07/25/2024 Start Time: 1100 End Time: 1200   Participation:  did not attend   Type of Therapy:  Group Therapy  Topic:  Finding Balance: Using Wise Mind for Thoughtful Decisions  Objective:  To help participants understand and apply the concept of Delsie Mind to make balanced, thoughtful decisions by integrating emotion and logic.  Goals: Learn the differences between Emotional Mind, Reasonable Mind, and [employer]. Recognize personal signs of Emotional and Reasonable Mind. Practice using [employer] in real-life scenarios.  Summary:  This class focused on Wise Mind--DBT's concept of balancing Emotional Mind and Reasonable Mind. We identified when we're in each state and practiced using Wise Mind to respond thoughtfully in real-life situations. By combining emotion and logic, participants can improve decision-making, manage challenges, and enhance relationships.  Therapeutic Modalities: Elements of Dialectical Behavior Therapy (DBT):  Mindfulness (noticing thoughts and emotions without judgment), Emotion Regulation (understanding and managing emotional responses), Distress Tolerance (coping with difficult situations without making them worse), Wise Mind (integrating emotion and reason for balanced decision-making) Elements of Cognitive Behavioral Therapy (CBT):  Identifying automatic thoughts, Challenging cognitive distortions, Using logic to reframe unhelpful thinking patterns   Catherene MALVA Dynes, LCSWA 07/25/2024  12:02 PM

## 2024-07-25 NOTE — Progress Notes (Signed)
   07/25/24 1000  Psych Admission Type (Psych Patients Only)  Admission Status Voluntary  Psychosocial Assessment  Patient Complaints None  Eye Contact Fair  Facial Expression Animated  Affect Appropriate to circumstance  Speech Logical/coherent  Interaction Assertive  Motor Activity Fidgety  Appearance/Hygiene Unremarkable  Behavior Characteristics Cooperative  Mood Pleasant  Thought Process  Coherency WDL  Content WDL  Delusions None reported or observed  Perception WDL  Hallucination None reported or observed  Judgment Impaired  Confusion None  Danger to Self  Current suicidal ideation? Denies  Agreement Not to Harm Self Yes  Description of Agreement verbal  Danger to Others  Danger to Others None reported or observed

## 2024-07-25 NOTE — Progress Notes (Signed)
 Tour of Duty:  Prentice JINNY Angle, RN, 07/25/24, Tour of Duty: [phone]  SI/HI/AVH: Denies  Self-Reported   Mood: Neutral  Anxiety: Denies Depression: Denies Irritability: Denies  Broset  Violence Prevention Guidelines *See Row Information*: (not recorded)   LBM  Last BM Date : 07/24/24   Pain: not present  Patient Refusals (including Rx): No  >>Shift Summary: Patient observed to be calm on unit. Patient able to make needs known. Patient observed to engage appropriately with staff and peers, except for poor boundaries noted with peer in [phone]. Patient taking medications as prescribed. This shift, no PRN medication requested or required. No reported or observed side effects to medication. No reported or observed agitation, aggression, or other acute emotional distress. No reported or observed physical abnormalities or concerns.   Last Vitals  Vitals Weight: 86.8 kg Temp: 98.7 F (37.1 C) Temp Source: Oral Pulse Rate: 82 Resp: 16 BP: (!) 127/97 (RN Heather aware) Patient Position: (not recorded)  Admission Type  Psych Admission Type (Psych Patients Only) Admission Status: Voluntary Date 72 hour document signed : (not recorded) Time 72 hour document signed : (not recorded) Provider Notified (First and Last Name) (see details for LINK to note): (not recorded)   Psychosocial Assessment  Psychosocial Assessment Patient Complaints: None Eye Contact: Fair Facial Expression: Animated Affect: Appropriate to circumstance Speech: Logical/coherent Interaction: Assertive Motor Activity: Other (Comment) (WDL) Appearance/Hygiene: Unremarkable Behavior Characteristics: Cooperative Mood: Pleasant   Aggressive Behavior  Targets: (not recorded)   Thought Process  Thought Process Coherency: Within Defined Limits Content: Within Defined Limits Delusions: None reported or observed Perception: Within Defined Limits Hallucination: None reported or observed Judgment:  Limited Confusion: None  Danger to Self/Others  Danger to Self Current suicidal ideation?: Denies Description of Suicide Plan: (not recorded) Self-Injurious Behavior: (not recorded) Agreement Not to Harm Self: (not recorded) Description of Agreement: (not recorded) Danger to Others: None reported or observed

## 2024-07-25 NOTE — Discharge Summary (Incomplete)
 Physician Discharge Summary Note  Patient:  William Allison is an 33 y.o., male MRN:  [id number] DOB:  1990/11/03 Patient phone:  [phone] (home)  Patient address:   [address]  [place] [address],  Total Time spent with patient: {Time; 15 min - 8 hours:17441}  Date of Admission:  07/21/2024 Date of Discharge: ***  Reason for Admission:  ***  Principal Problem: Bipolar disorder, unspecified (HCC) Discharge Diagnoses: Principal Problem:   Bipolar disorder, unspecified (HCC) Active Problems:   Methamphetamine use disorder, mild, abuse (HCC)   Cannabis use disorder, severe, dependence (HCC)   Polysubstance abuse (HCC)   Cocaine use disorder, mild, abuse (HCC)   Alcohol dependence with alcohol-induced mood disorder (HCC)   Anxiety   Past Psychiatric History: ***  Past Medical History:  Past Medical History:  Diagnosis Date   ADD (attention deficit disorder)    Constipation 05/30/12   [facility], [facility] Just; Donnatal q.i.d. Follow up in 4 weeks.   GERD (gastroesophageal reflux disease)     Past Surgical History:  Procedure Laterality Date   HAND SURGERY     for fracture   LAPAROSCOPIC APPENDECTOMY N/A 12/26/2016   Procedure: APPENDECTOMY LAPAROSCOPIC;  Surgeon: Morene Olives, MD;  Location: [facility];  Service: General;  Laterality: N/A;   TYMPANOSTOMY TUBE PLACEMENT     Family History: History reviewed. No pertinent family history. Family Psychiatric  History: *** Social History:  Social History   Substance and Sexual Activity  Alcohol Use Yes   Alcohol/week: 24.0 standard drinks of alcohol   Types: 24 Cans of beer per week   Comment: drinks a case of beer a day     Social History   Substance and Sexual Activity  Drug Use Yes   Types: Marijuana, Cocaine, Methamphetamines    Social History   Socioeconomic History   Marital status: Single    Spouse name: Not on file   Number of children: Not on file   Years of education: Not on file    Highest education level: Not on file  Occupational History   Not on file  Tobacco Use   Smoking status: Every Day    Current packs/day: 0.50    Average packs/day: 0.5 packs/day for 0.8 years (0.4 ttl pk-yrs)    Types: Cigarettes    Start date: 2025   Smokeless tobacco: Never  Vaping Use   Vaping status: Never Used  Substance and Sexual Activity   Alcohol use: Yes    Alcohol/week: 24.0 standard drinks of alcohol    Types: 24 Cans of beer per week    Comment: drinks a case of beer a day   Drug use: Yes    Types: Marijuana, Cocaine, Methamphetamines   Sexual activity: Yes  Other Topics Concern   Not on file  Social History Narrative   Not on file   Social Drivers of Health   Financial Resource Strain: Not on file  Food Insecurity: Food Insecurity Present (07/21/2024)   Hunger Vital Sign    Worried About Running Out of Food in the Last Year: Often true    Ran Out of Food in the Last Year: Often true  Transportation Needs: Unmet Transportation Needs (07/21/2024)   PRAPARE - Administrator, Civil Service (Medical): Yes    Lack of Transportation (Non-Medical): Yes  Physical Activity: Not on file  Stress: Not on file  Social Connections: Not on file    Hospital Course:  ***  Physical Findings: AIMS:  , ,  ,  ,  ,  ,  CIWA:  CIWA-Ar Total: 0 COWS:     Musculoskeletal: Strength & Muscle Tone: {desc; muscle tone:32375} Gait & Station: {PE GAIT ED WJUO:77474} Patient leans: {Patient Leans:21022755}   Psychiatric Specialty Exam:  Presentation  General Appearance:  Appropriate for Environment  Eye Contact: Good  Speech: Clear and Coherent  Speech Volume: Normal  Handedness: Right   Mood and Affect  Mood: Euphoric  Affect: Appropriate   Thought Process  Thought Processes: Coherent  Descriptions of Associations:Intact  Orientation:Full (Time, Place and Person)  Thought Content:Logical; Abstract Reasoning  History of  Schizophrenia/Schizoaffective disorder:No  Duration of Psychotic Symptoms:N/A  Hallucinations:Hallucinations: None  Ideas of Reference:None  Suicidal Thoughts:Suicidal Thoughts: No  Homicidal Thoughts:Homicidal Thoughts: No   Sensorium  Memory: Immediate Good; Recent Good; Remote Good  Judgment: Good  Insight: Good   Executive Functions  Concentration: Fair  Attention Span: Good  Recall: Good  Fund of Knowledge: Good  Language: Good   Psychomotor Activity  Psychomotor Activity: Psychomotor Activity: Normal   Assets  Assets: Talents/Skills; Physical Health; Resilience; Desire for Improvement; Communication Skills   Sleep  Sleep: Sleep: Good  Estimated Sleeping Duration (Last 24 Hours): 6.50-8.00 hours   Physical Exam: Physical Exam ROS Blood pressure (!) 123/97, pulse 82, temperature (!) 97.5 F (36.4 C), temperature source Oral, resp. rate 18, height 6' 4 (1.93 m), weight 86.8 kg, SpO2 100%. Body mass index is 23.3 kg/m.   Social History   Tobacco Use  Smoking Status Every Day   Current packs/day: 0.50   Average packs/day: 0.5 packs/day for 0.8 years (0.4 ttl pk-yrs)   Types: Cigarettes   Start date: 2025  Smokeless Tobacco Never   Tobacco Cessation:  {Discharge tobacco cessation prescription:304700209}   Blood Alcohol level:  Lab Results  Component Value Date   ETH 72 (H) 07/21/2024   ETH 51 (H) 07/19/2024    Metabolic Disorder Labs:  No results found for: HGBA1C, MPG No results found for: PROLACTIN No results found for: CHOL, TRIG, HDL, CHOLHDL, VLDL, LDLCALC  See Psychiatric Specialty Exam and Suicide Risk Assessment completed by Attending Physician prior to discharge.  Discharge destination:  {DISCHARGE DESTINATION:22616}  Is patient on multiple antipsychotic therapies at discharge:  {RECOMMEND TAPERING:22617}   Has Patient had three or more failed trials of antipsychotic monotherapy by history:   {BHH ANTIPSYCHOTIC:22903}  Recommended Plan for Multiple Antipsychotic Therapies: {B[facility] MULTIPLE ANTIPSYCHOTIC THERAPIES:22905}   Allergies as of 07/25/2024   No Known Allergies   Med Rec must be completed prior to using this SMARTLINK***       Follow-up Information     [facility]. Go on 07/30/2024.   Specialty: Professional Counselor Why: You may go to this provider on 07/30/24 at 9:00 am for an assessment, to obtain therapy services. You may also go Monday through Friday, from 9 am to 1 pm for your initial assessment. Contact information: [address]  [address] [place] [address] [phone]         [facility]. Go on 08/06/2024.   Specialty: Behavioral Health Why: You may go to this provider on 08/06/24 at 7:00 am for an assessment, to obtain medication management services. You may also go Monday through Friday, arrive by 7:00 am for your initial assessment. Contact information: [address] [place]  [address] [phone]        [facility]. Go on 07/31/2024.   Specialty: Behavioral Health Why: You have an appointment on 07/31/24 at 1:00 pm for  the substance abuse intensive outpatient therapy program (SAIOP).  This program meets in-person M, W, F from 9 am to 12 pm and runs for 8 to 12 weeks.  Clients can also receive individual and family therapy and MAT.  There is weekly drug testing.  This program is abstinence-based and AA, NA, [facility], etc attendance is encouraged.  For questions, please call [phone]. Contact information: [address] [place]  [address] [phone]                Follow-up recommendations:  {BHH DC FU RECOMMENDATIONS:22620}  Comments:  ***  Signed: Lamar Sherlean Jama Chandra, DO 07/25/2024, 4:20 PM

## 2024-07-25 NOTE — Progress Notes (Signed)
 [facility] MD Progress Note 07/25/2024 7:10 AM William Allison  MRN:  [id number] Principal Problem: Bipolar disorder, unspecified (HCC) Diagnosis: Principal Problem:   Bipolar disorder, unspecified (HCC) Active Problems:   Methamphetamine use disorder, mild, abuse (HCC)   Cannabis use disorder, severe, dependence (HCC)   Polysubstance abuse (HCC)   Cocaine use disorder, mild, abuse (HCC)   Alcohol dependence with alcohol-induced mood disorder (HCC)   Anxiety   Subjective:   William Allison is a 88 male with a Hx of polysubstance abuse who presents for anxiety and detox.  Chart Review from last 24 hours and discussion during bed progression: The patient's chart was reviewed and nursing notes were reviewed. The patient's case was discussed in multidisciplinary team meeting.   - events per chart review / staff report: none - Patient received all scheduled medications  Per Patient:  Chart reviewed. CIWA was 0, VSS. Continues to be calm, friendly and interacts well with as well as staff/peers. Intermittently attends groups. Fairly engaged after lunch in groups. Denies physiological side effects of withdrawal, endorse some cravings. Discussed Disulfiram and Naltrexone. Pt prefers to start Naltrexone for MAT. Discussed r/b/se of Naltrexone. Denies SI, HI and AVH. Depression is 0/10 and no anxiety. Confirmed D/C for tomorrow 10/24  Sleep: Good Appetite:  Good Depression: mild Anxiety: mild Auditory Hallucinations: neg Visual Hallucinations: neg Paranoia: neg HI: neg SI: neg Side effects from medications: does not endorse any side-effects they attribute to medications. Other concerns discussed with patient:  ROS  Total time spent with patient: 35 minutes   Past Medical History (auto-populated):  Past Medical History:  Diagnosis Date   ADD (attention deficit disorder)    Constipation 05/30/12   [facility], [facility]; Donnatal q.i.d. Follow up in 4 weeks.   GERD  (gastroesophageal reflux disease)     Past Surgical History:  Procedure Laterality Date   HAND SURGERY     for fracture   LAPAROSCOPIC APPENDECTOMY N/A 12/26/2016   Procedure: APPENDECTOMY LAPAROSCOPIC;  Surgeon: Morene Olives, MD;  Location: [facility];  Service: General;  Laterality: N/A;   TYMPANOSTOMY TUBE PLACEMENT      Social History:  Social History   Substance and Sexual Activity  Alcohol Use Yes   Alcohol/week: 24.0 standard drinks of alcohol   Types: 24 Cans of beer per week   Comment: drinks a case of beer a day     Social History   Substance and Sexual Activity  Drug Use Yes   Types: Marijuana, Cocaine, Methamphetamines    Social History   Socioeconomic History   Marital status: Single    Spouse name: Not on file   Number of children: Not on file   Years of education: Not on file   Highest education level: Not on file  Occupational History   Not on file  Tobacco Use   Smoking status: Every Day    Current packs/day: 0.50    Average packs/day: 0.5 packs/day for 0.8 years (0.4 ttl pk-yrs)    Types: Cigarettes    Start date: 2025   Smokeless tobacco: Never  Vaping Use   Vaping status: Never Used  Substance and Sexual Activity   Alcohol use: Yes    Alcohol/week: 24.0 standard drinks of alcohol    Types: 24 Cans of beer per week    Comment: drinks a case of beer a day   Drug use: Yes    Types: Marijuana, Cocaine, Methamphetamines   Sexual activity: Yes  Other Topics Concern  Not on file  Social History Narrative   Not on file   Social Drivers of Health   Financial Resource Strain: Not on file  Food Insecurity: Food Insecurity Present (07/21/2024)   Hunger Vital Sign    Worried About Running Out of Food in the Last Year: Often true    Ran Out of Food in the Last Year: Often true  Transportation Needs: Unmet Transportation Needs (07/21/2024)   PRAPARE - Administrator, Civil Service (Medical): Yes    Lack of Transportation  (Non-Medical): Yes  Physical Activity: Not on file  Stress: Not on file  Social Connections: Not on file   Additional Social History:   -Homeless - Jobless - Legal problems and on probation - Previous history of incarceration  Objective: Medications, Labs, Mental Status Exam, Physical Exam Current Medications: Current Facility-Administered Medications  Medication Dose Route Frequency Provider Last Rate Last Admin   acetaminophen  (TYLENOL ) tablet 650 mg  650 mg Oral Q6H PRN White, Patrice L, NP   650 mg at 07/21/24 1527   alum & mag hydroxide-simeth (MAALOX/MYLANTA) 200-200-20 MG/5ML suspension 30 mL  30 mL Oral Q4H PRN White, Patrice L, NP       ARIPiprazole (ABILIFY) tablet 2 mg  2 mg Oral Q2000 Chandra Charleston Christian Lee, DO       chlordiazePOXIDE (LIBRIUM) capsule 25 mg  25 mg Oral Daily White, Patrice L, NP       haloperidol  (HALDOL ) tablet 5 mg  5 mg Oral TID PRN White, Patrice L, NP       And   diphenhydrAMINE  (BENADRYL ) capsule 50 mg  50 mg Oral TID PRN White, Patrice L, NP       haloperidol  lactate (HALDOL ) injection 5 mg  5 mg Intramuscular TID PRN White, Patrice L, NP       And   diphenhydrAMINE  (BENADRYL ) injection 50 mg  50 mg Intramuscular TID PRN White, Patrice L, NP       And   LORazepam  (ATIVAN ) injection 2 mg  2 mg Intramuscular TID PRN White, Patrice L, NP       haloperidol  lactate (HALDOL ) injection 10 mg  10 mg Intramuscular TID PRN White, Patrice L, NP       And   diphenhydrAMINE  (BENADRYL ) injection 50 mg  50 mg Intramuscular TID PRN White, Patrice L, NP       And   LORazepam  (ATIVAN ) injection 2 mg  2 mg Intramuscular TID PRN White, Patrice L, NP       hydrOXYzine  (ATARAX ) tablet 25 mg  25 mg Oral TID PRN White, Patrice L, NP   25 mg at 07/24/24 2113   magnesium  hydroxide (MILK OF MAGNESIA) suspension 30 mL  30 mL Oral Daily PRN White, Patrice L, NP       multivitamin with minerals tablet 1 tablet  1 tablet Oral Daily White, Patrice L, NP   1 tablet at  07/24/24 9193   nicotine  polacrilex (NICORETTE ) gum 2 mg  2 mg Oral PRN Prentis Kitchens A, DO   2 mg at 07/24/24 1942   thiamine  (Vitamin B-1) tablet 100 mg  100 mg Oral Daily White, Patrice L, NP   100 mg at 07/24/24 9193   traZODone  (DESYREL ) tablet 50 mg  50 mg Oral QHS PRN White, Patrice L, NP   50 mg at 07/24/24 2113    Lab Results:  No results found for this or any previous visit (from the past 48 hours).  Blood Alcohol level:  Lab Results  Component Value Date   ETH 72 (H) 07/21/2024   ETH 51 (H) 07/19/2024    Metabolic Disorder Labs: No results found for: HGBA1C, MPG No results found for: PROLACTIN No results found for: CHOL, TRIG, HDL, CHOLHDL, VLDL, LDLCALC  Physical Findings: AIMS:  , ,  ,  ,    CIWA:  CIWA-Ar Total: 0 COWS:     Musculoskeletal: Strength & Muscle Tone: within normal limits Gait & Station: normal Patient leans: N/A  Mental Status Exam: General Appearance: Disheveled  Orientation:  Full (Time, Place, and Person)  Memory:  Immediate;   Good Remote;   Good  Concentration:  Concentration: Good  Recall:  Good  Attention  Good  Eye Contact:  Good  Speech:  Clear and Coherent  Language:  Good  Volume:  Normal  Mood:  Pretty Good   Affect:  Full Range  Thought Process:  Coherent  Thought Content:  Logical  Suicidal Thoughts:  No  Homicidal Thoughts:  No  Judgement:  Fair  Insight:  Fair  Psychomotor Activity:  Normal  Akathisia:  No  Fund of Knowledge:  Fair      Assets:  Physical Health  Cognition:  WNL  ADL's:  Intact   Physical Exam: Physical Exam  Blood pressure (!) 123/97, pulse 82, temperature (!) 97.5 F (36.4 C), temperature source Oral, resp. rate 18, height 6' 4 (1.93 m), weight 86.8 kg, SpO2 100%. Body mass index is 23.3 kg/m.  ASSESSMENT:  Diagnoses / Active Problems: Diagnosis:  Principal Problem:   Alcohol dependence with alcohol-induced mood disorder (HCC) Active Problems:   Cocaine use  disorder, mild, abuse (HCC)   Anxiety  Mr. Jadien Lehigh is a 33 y/o male, currently homeless, with a history of reported ADHD and anxiety, and a long history of heavy alcohol use and frequent to daily cocaine use who presented voluntarily for detoxification. His UDS was positive for amphetamines and benzodiazepines, which is consistent with his PDMP report. He presents still acutely intoxicated, with a BAL this morning of 72. He is at high risk for complicated withdrawal, and was started on CIWA protocol and scheduled librium taper upon admission.   07/22/2024 -mood, affect and behavior are stable. Comfortable and withdrawal is mild, CIWA 4,5. VSS. Discussed starting Celexa with patient regarding mood, though is reluctant to start any meds. Not interested in stopping BDZ or stimulants.  07/23/2024 - Mood and affect are stable.  Withdrawal continues to be mild, CIWA scores 2, 3.  Vital signs stable.  We will discontinue Celexa after discussion about the patient's family history and remote history.  Notably, patient reports mother has a diagnosis of bipolar disorder and the patient reports several weeks over the course of 2 years of discrete episodes of increased goal-directed activity, sleeplessness and increased energy as well as impulsiveness would last several weeks at a time followed by some depressed mood.  These occurred in the absence of excessive caffeine or substance abuse. In place of Celexa, we will initiate Abilify for mood stabilization.  10/22 - Continued stability in terms of mood and affect.  Withdrawal is stable and CIWA score is 2 with able vital signs.  I will move Abilify to the evening given patient's reports of feeling uneasy with it. - Discharge plan will be for Friday 10/24 to the substance abuse intensive outpatient program.  The patient agrees with this plan.  10/23 Stable in terms of mood affect and behavior. W/D is stable  and will complete Librium taper today. Plan to D/C  tomorrow to SA-IOP with shelter. Will initiate Naltrexone for MAT after edcuating patient who agrees to this medication.    PLAN: Safety and Monitoring:  -- Voluntary admission to inpatient psychiatric unit for safety, stabilization and treatment  -- Daily contact with patient to assess and evaluate symptoms and progress in treatment  -- Patient's case to be discussed in multi-disciplinary team meeting  -- Observation Level : q15 minute checks  -- Vital signs:  q12 hours  -- Precautions: suicide, elopement, and assault  2. Psychiatric Diagnoses and Treatment:  - Continue Abilify 2 mg for mood stabilization [facility] - Discontinue Celexa Celexa to family history of bipolar disorder and his reported periods of elevated mood, increased goal-directed activity and absence of substance use - CIWA with librium prn - Librium taper: 100 mg day 1, 75 mg day 2, 50 mg day 3, then 25 mg day 4 then stop - Start Naltrexone 50mg  today  The risks/benefits/side-effects/alternatives to this medication were discussed in detail with the patient and time was given for questions. The patient consents to medication trial.  Metabolic profile and EKG monitoring obtained while on an atypical antipsychotic  Encouraged patient to participate in unit milieu and in scheduled group therapies   -- Short Term Goals: Ability to verbalize feelings will improve, Ability to identify and develop effective coping behaviors will improve, and Ability to maintain clinical measurements within normal limits will improve  -- Long Term Goals: Improvement in symptoms so as ready for discharge    3. Medical Issues Being Addressed:   Substance abuse and withdrawal  Labs reviewed,  UDS positive for amphetamines, benzos, THC, and cocaine. Etoh level 72 this morning at 7am.  - QTC: - Ordering hemoglobin A1c and lipid panel for antipsychotic monitoring   Tobacco Use Disorder  --  Patient in need of nicotine  replacement; nicotine   polacrilex (gum) ordered. Smoking cessation encouraged  -- Smoking cessation encouraged  4. Discharge Planning:   -- Social work and case management to assist with discharge planning and identification of hospital follow-up needs prior to discharge  -- Estimated discharge: 07/26/2024, patient needs a outpatient substance abuse program or residential substance abuse  -- Discharge Concerns: Need to establish a safety plan; Medication compliance and effectiveness  -- Discharge Goals: Return home with outpatient referrals for mental health follow-up including medication management/psychotherapy   Lamar Sherlean Jama Chandra, DO 07/25/2024, 7:10 AM

## 2024-07-25 NOTE — Group Note (Signed)
 Date:  07/25/2024 Time:  8:44 PM  Group Topic/Focus:  Wrap-Up Group:   The focus of this group is to help patients review their daily goal of treatment and discuss progress on daily workbooks.    Participation Level:  Active  Participation Quality:  Appropriate  Affect:  Appropriate  Cognitive:  Appropriate  Insight: Good  Engagement in Group:  Engaged  Modes of Intervention:  Socialization  Additional CommentsBETHA Kristen VEAR Allison 07/25/2024, 8:44 PM

## 2024-07-25 NOTE — Plan of Care (Signed)
   Problem: Education: Goal: Emotional status will improve Outcome: Progressing Goal: Mental status will improve Outcome: Progressing

## 2024-07-25 NOTE — Group Note (Signed)
 Date:  07/25/2024 Time:  10:31 AM  Group Topic/Focus:  Building Self Esteem:   The Focus of this group is helping patients become aware of the effects of self-esteem on their lives, the things they and others do that enhance or undermine their self-esteem, seeing the relationship between their level of self-esteem and the choices they make and learning ways to enhance self-esteem. Managing Feelings:   The focus of this group is to identify what feelings patients have difficulty handling and develop a plan to handle them in a healthier way upon discharge.    Participation Level:  Did Not Attend  Participation Quality:  did not attend  Affect:  did not attend  Cognitive:  did not attend  Insight: None  Engagement in Group:  did not attend  Modes of Intervention:  did not attend  Additional Comments:  did not attend  Akhil Piscopo E Shealyn Sean 07/25/2024, 10:31 AM

## 2024-07-25 NOTE — Progress Notes (Signed)
(  Sleep Hours) - 8  (Any PRNs that were needed, meds refused, or side effects to meds)- trazodone  50mg , atarax  25mg , nicorette  gum 2mg , no meds refused, no side effects  (Any disturbances and when (visitation, over night)- n/a  (Concerns raised by the patient)- n/a  (SI/HI/AVH)- denies

## 2024-07-25 NOTE — B[facility] Suicide Risk Assessment (Signed)
 [address] [facility] Discharge Suicide Risk Assessment   Principal Problem: Bipolar disorder, unspecified (HCC) Discharge Diagnoses: Principal Problem:   Bipolar disorder, unspecified (HCC) Active Problems:   Methamphetamine use disorder, mild, abuse (HCC)   Cannabis use disorder, severe, dependence (HCC)   Polysubstance abuse (HCC)   Cocaine use disorder, mild, abuse (HCC)   Alcohol dependence with alcohol-induced mood disorder (HCC)   Anxiety   Total Time spent with patient: 30 minutes  Musculoskeletal: Strength & Muscle Tone: within normal limits Gait & Station: normal Patient leans: N/A  Psychiatric Specialty Exam  Presentation  General Appearance:  Appropriate for Environment  Eye Contact: Good  Speech: Clear and Coherent  Speech Volume: Normal  Handedness: Right   Mood and Affect  Mood: Euphoric  Duration of Depression Symptoms: No data recorded Affect: Appropriate   Thought Process  Thought Processes: Coherent  Descriptions of Associations:Intact  Orientation:Full (Time, Place and Person)  Thought Content:Logical; Abstract Reasoning  History of Schizophrenia/Schizoaffective disorder:No  Duration of Psychotic Symptoms:N/A  Hallucinations:Hallucinations: None  Ideas of Reference:None  Suicidal Thoughts:Suicidal Thoughts: No  Homicidal Thoughts:Homicidal Thoughts: No   Sensorium  Memory: Immediate Good; Recent Good; Remote Good  Judgment: Good  Insight: Good   Executive Functions  Concentration: Fair  Attention Span: Good  Recall: Good  Fund of Knowledge: Good  Language: Good   Psychomotor Activity  Psychomotor Activity: Psychomotor Activity: Normal   Assets  Assets: Talents/Skills; Physical Health; Resilience; Desire for Improvement; Communication Skills   Sleep  Sleep: Sleep: Good  Estimated Sleeping Duration (Last 24 Hours): 6.50-8.00 hours  Physical Exam: Physical Exam ROS Blood pressure (!) 123/97, pulse  82, temperature (!) 97.5 F (36.4 C), temperature source Oral, resp. rate 18, height 6' 4 (1.93 m), weight 86.8 kg, SpO2 100%. Body mass index is 23.3 kg/m.  Mental Status Per Nursing Assessment::   On Admission:  NA  Demographic Factors:  Male, Caucasian, Low socioeconomic status, Living alone, and Unemployed  Loss Factors: Decrease in vocational status, Legal issues, and Financial problems/change in socioeconomic status  Historical Factors: NA  Risk Reduction Factors:   Positive coping skills or problem solving skills  Continued Clinical Symptoms:  Alcohol/Substance Abuse/Dependencies  Cognitive Features That Contribute To Risk:  None    Suicide Risk:  Mild:  Suicidal ideation of limited frequency, intensity, duration, and specificity.  There are no identifiable plans, no associated intent, mild dysphoria and related symptoms, good self-control (both objective and subjective assessment), few other risk factors, and identifiable protective factors, including available and accessible social support.   Follow-up Information     [place]. Go on 07/30/2024.   Specialty: Professional Counselor Why: You may go to this provider on 07/30/24 at 9:00 am for an assessment, to obtain therapy services. You may also go Monday through Friday, from 9 am to 1 pm for your initial assessment. Contact information: [address]  [address] [place] [address] [phone]         [facility]. Go on 08/06/2024.   Specialty: Behavioral Health Why: You may go to this provider on 08/06/24 at 7:00 am for an assessment, to obtain medication management services. You may also go Monday through Friday, arrive by 7:00 am for your initial assessment. Contact information: [address] [place]  [address] [phone]        [facility]. Go on 07/31/2024.   Specialty: Behavioral Health Why: You have an  appointment on 07/31/24 at 1:00 pm for the substance  abuse intensive outpatient therapy program (SAIOP).  This program meets in-person M, W, F from 9 am to 12 pm and runs for 8 to 12 weeks.  Clients can also receive individual and family therapy and MAT.  There is weekly drug testing.  This program is abstinence-based and AA, NA, Smart Recovery, etc attendance is encouraged.  For questions, please call [phone]. Contact information: [address] [place]  [address] [phone]                Plan Of Care/Follow-up recommendations:  {[facility] DC FU RECOMMENDATIONS:22620}  Lamar Sherlean Jama Chandra, DO 07/25/2024, 4:45 PM

## 2024-07-25 NOTE — Progress Notes (Signed)
 CSW spoke with patient regarding discharge planning. Patient reported not knowing exactly where he was going to go following discharge, but reported he may stay with a friend or discharge to shelter. CSW to meet with patient after lunch as he was sleeping.  Januel Doolan, LCSWA 07/25/24 10:22AM

## 2024-07-25 NOTE — Plan of Care (Signed)
  Problem: Education: Goal: Knowledge of [facility] Education information/materials will improve Outcome: Progressing Goal: Emotional status will improve Outcome: Progressing Goal: Mental status will improve Outcome: Progressing Goal: Verbalization of understanding the information provided will improve Outcome: Progressing   Problem: Activity: Goal: Interest or engagement in activities will improve Outcome: Progressing   Problem: Coping: Goal: Ability to demonstrate self-control will improve Outcome: Progressing

## 2024-07-25 NOTE — Group Note (Signed)
 Date:  07/25/2024 Time:  12:05 PM  Group Topic/Focus:  Nutrition Group    Participation Level:  Did Not Attend  Participation Quality:  n/a  Affect:  n/a  Cognitive:  n/a  Insight: None  Engagement in Group:  n/a  Modes of Intervention:  n/a  Additional Comments:  did not attend  Jonay Hitchcock E Mahealani Sulak 07/25/2024, 12:05 PM

## 2024-07-25 NOTE — Group Note (Signed)
 Recreation Therapy Group Note   Group Topic:Other  Group Date: 07/25/2024 Start Time: 1300 End Time: 1400 Facilitators: Kaleena Corrow-McCall, LRT,CTRS Location: [address] Day[address]   Activity Description/Intervention: Therapeutic Drumming. Patients with peers and staff were given the opportunity to engage in a leader facilitated [facility] Empowerment Drumming Circle with staff from the [employer], in partnership with [employer]. Teaching laboratory technician and trained [employer], Norleen Mon leading with LRT observing and documenting intervention and pt response. This evidenced-based practice targets 7 areas of health and wellbeing in the human experience including: stress-reduction, exercise, self-expression, camaraderie/support, nurturing, spirituality, and music-making (leisure).   Goal Area(s) Addresses:  Patient will engage in pro-social way in music group.  Patient will follow directions of drum leader on the first prompt. Patient will demonstrate no behavioral issues during group.  Patient will identify if a reduction in stress level occurs as a result of participation in therapeutic drum circle.    Education: Leisure exposure, Pharmacologist, Musical expression, Discharge Planning    Affect/Mood: Appropriate   Participation Level: Engaged   Participation Quality: Independent   Behavior: Appropriate   Speech/Thought Process: Focused   Insight: Good   Judgement: Good   Modes of Intervention: Teaching laboratory technician   Patient Response to Interventions:  Engaged   Education Outcome:  In group clarification offered    Clinical Observations/Individualized Feedback: Tyriq actively engaged in therapeutic drumming exercise and discussions. Pt was appropriate with peers, staff, and musical equipment for duration of programming.    Plan: Continue to engage patient in RT group sessions 2-3x/week.   Kaeleb Emond-McCall, LRT,CTRS 07/25/2024 2:13  PM

## 2024-07-25 NOTE — Plan of Care (Signed)
   Problem: Education: Goal: Emotional status will improve Outcome: Progressing Goal: Mental status will improve Outcome: Progressing   Problem: Activity: Goal: Interest or engagement in activities will improve Outcome: Progressing

## 2024-07-26 ENCOUNTER — Ambulatory Visit (HOSPITAL_COMMUNITY): Payer: Self-pay

## 2024-07-26 DIAGNOSIS — F1510 Other stimulant abuse, uncomplicated: Secondary | ICD-10-CM

## 2024-07-26 DIAGNOSIS — F1910 Other psychoactive substance abuse, uncomplicated: Secondary | ICD-10-CM

## 2024-07-26 DIAGNOSIS — F1220 Cannabis dependence, uncomplicated: Secondary | ICD-10-CM

## 2024-07-26 DIAGNOSIS — F319 Bipolar disorder, unspecified: Principal | ICD-10-CM

## 2024-07-26 MED ORDER — HYDROXYZINE HCL 25 MG PO TABS: 25.0000 mg | ORAL_TABLET | Freq: Three times a day (TID) | ORAL | 0 refills | Status: AC | PRN

## 2024-07-26 MED ORDER — TRAZODONE HCL 50 MG PO TABS: 50.0000 mg | ORAL_TABLET | Freq: Every evening | ORAL | 0 refills | Status: AC | PRN

## 2024-07-26 MED ORDER — NICOTINE POLACRILEX 2 MG MT GUM: 2.0000 mg | CHEWING_GUM | OROMUCOSAL | 0 refills | Status: AC | PRN

## 2024-07-26 MED ORDER — TRAZODONE HCL 50 MG PO TABS: 50.0000 mg | ORAL_TABLET | Freq: Every evening | ORAL | 0 refills | Status: DC | PRN

## 2024-07-26 MED ORDER — ADULT MULTIVITAMIN W/MINERALS CH: 1.0000 | ORAL_TABLET | Freq: Every day | ORAL | 0 refills | Status: AC

## 2024-07-26 MED ORDER — NICOTINE POLACRILEX 2 MG MT GUM: 2.0000 mg | CHEWING_GUM | OROMUCOSAL | 0 refills | Status: DC | PRN

## 2024-07-26 MED ORDER — HYDROXYZINE HCL 25 MG PO TABS: 25.0000 mg | ORAL_TABLET | Freq: Three times a day (TID) | ORAL | 0 refills | Status: DC | PRN

## 2024-07-26 MED ORDER — NALTREXONE HCL 50 MG PO TABS: 50.0000 mg | ORAL_TABLET | Freq: Every day | ORAL | 0 refills | Status: DC

## 2024-07-26 MED ORDER — ARIPIPRAZOLE 2 MG PO TABS: 2.0000 mg | ORAL_TABLET | Freq: Every day | ORAL | 0 refills | Status: AC

## 2024-07-26 MED ORDER — NALTREXONE HCL 50 MG PO TABS: 50.0000 mg | ORAL_TABLET | Freq: Every day | ORAL | 0 refills | Status: AC

## 2024-07-26 MED ORDER — ARIPIPRAZOLE 2 MG PO TABS: 2.0000 mg | ORAL_TABLET | Freq: Every day | ORAL | 0 refills | Status: DC

## 2024-07-26 MED ORDER — VITAMIN B-1 100 MG PO TABS: 100.0000 mg | ORAL_TABLET | Freq: Every day | ORAL | 0 refills | Status: AC

## 2024-07-26 NOTE — Progress Notes (Signed)
(  Sleep Hours) -8 (Any PRNs that were needed, meds refused, or side effects to meds)- Atarax ,Trazodone . And nicotine  gum. (Any disturbances and when (visitation, over night)-none (Concerns raised by the patient)- none (SI/HI/AVH)-denied

## 2024-07-26 NOTE — Progress Notes (Signed)
 Patient verbalizes readiness for discharge. All patient belongings returned to patient. Discharge instructions read and discussed with patient (appointments, medications, resources). Patient expressed gratitude for care provided. Patient discharged to lobby at 0945 where taxi driver was waiting.

## 2024-07-26 NOTE — Progress Notes (Signed)
  [facility] Adult Case Management Discharge Plan :  Will you be returning to the same living situation after discharge:  No. At discharge, do you have transportation home?: Yes,  pt will be transported to [facility] via taxi voucher. Do you have the ability to pay for your medications: Yes,  pt reported being able to afford medications.  Release of information consent forms completed and in the chart;  Patient's signature needed at discharge.  Patient to Follow up at:  Follow-up Information     [place]. Go on 07/30/2024.   Specialty: Professional Counselor Why: You may go to this provider on 07/30/24 at 9:00 am for an assessment, to obtain therapy services. You may also go Monday through Friday, from 9 am to 1 pm for your initial assessment. Contact information: [address]  [address] [place] [address] [phone]         [facility]. Go on 08/06/2024.   Specialty: Behavioral Health Why: You may go to this provider on 08/06/24 at 7:00 am for an assessment, to obtain medication management services. You may also go Monday through Friday, arrive by 7:00 am for your initial assessment. Contact information: [address] [place]  [address] [phone]        [facility]. Go on 07/31/2024.   Specialty: Behavioral Health Why: You have an appointment on 07/31/24 at 1:00 pm for the substance abuse intensive outpatient therapy program (SAIOP).  This program meets in-person M, W, F from 9 am to 12 pm and runs for 8 to 12 weeks.  Clients can also receive individual and family therapy and MAT.  There is weekly drug testing.  This program is abstinence-based and AA, NA, Smart Recovery, etc attendance is encouraged.  For questions, please call [phone]. Contact information: [address] [place]  [phone]                Next level of care provider has access to [facility] Link:no  Safety Planning and Suicide Prevention discussed: Yes,  completed with patient      Has patient been referred to the Quitline?: Patient refused referral for treatment  Patient has been referred for addiction treatment: Yes, the patient will follow up with an outpatient provider for substance use disorder. Psychiatrist/APP: appointment made: [facility] - SAIOP Appt on 10/29 at 1:00PM.  Zriyah Kopplin M Aliviana Burdell, LCSWA 07/26/2024, 8:58 AM

## 2024-07-26 NOTE — Group Note (Signed)
 Date:  07/26/2024 Time:  9:09 AM  Group Topic/Focus:  Goals Group:   The focus of this group is to help patients establish daily goals to achieve during treatment and discuss how the patient can incorporate goal setting into their daily lives to aide in recovery.    Participation Level:  Minimal  Participation Quality:  Intrusive and Monopolizing  Affect:  Excited  Cognitive:  Alert  Insight: Improving and Lacking  Engagement in Group:  Distracting, Improving, and Monopolizing  Modes of Intervention:  Discussion, Exploration, Socialization, and Support  Additional Comments:  Patient attended and minimally participated in goals group. Patient was redirected numerous times as he kept talking over his peers and to the peers next to him. He was reluctant to participate in sharing his goal and declined writing on his goal worksheet.  Kristi HERO Shanel Prazak 07/26/2024, 9:09 AM

## 2024-07-31 ENCOUNTER — Ambulatory Visit (HOSPITAL_COMMUNITY): Payer: Self-pay

## 2024-08-01 ENCOUNTER — Encounter (HOSPITAL_COMMUNITY): Payer: Self-pay

## 2024-08-01 ENCOUNTER — Telehealth (HOSPITAL_COMMUNITY): Payer: Self-pay

## 2024-08-01 NOTE — Telephone Encounter (Signed)
 Therapist attempted to call this pt as he now showed for his CCA for SA IOP.  The message said the call could not be completed as dialed. Please check the directory or call again latter. Therapist will send a letter.  Darice Simpler, MS, LMFT, LCAS

## 2024-08-07 ENCOUNTER — Other Ambulatory Visit (HOSPITAL_BASED_OUTPATIENT_CLINIC_OR_DEPARTMENT_OTHER): Payer: Self-pay

## 2024-08-07 MED ORDER — CLONAZEPAM 1 MG PO TABS: 0.5000 mg | ORAL_TABLET | Freq: Every day | ORAL | 5 refills | Status: AC

## 2024-08-07 MED ORDER — AMPHETAMINE-DEXTROAMPHETAMINE 30 MG PO TABS
1.0000 | ORAL_TABLET | Freq: Two times a day (BID) | ORAL | 0 refills | Status: DC
  Filled 2024-08-12: qty 60, 30d supply, fill #0

## 2024-08-07 MED ORDER — CLONAZEPAM 1 MG PO TABS
0.5000 mg | ORAL_TABLET | Freq: Every day | ORAL | 5 refills | Status: AC
  Filled 2024-08-12: qty 30, 30d supply, fill #0
  Filled 2024-09-11: qty 30, 30d supply, fill #1
  Filled 2024-10-10 – 2024-10-21 (×2): qty 30, 30d supply, fill #2

## 2024-08-12 ENCOUNTER — Other Ambulatory Visit (HOSPITAL_BASED_OUTPATIENT_CLINIC_OR_DEPARTMENT_OTHER): Payer: Self-pay

## 2024-09-11 ENCOUNTER — Other Ambulatory Visit (HOSPITAL_BASED_OUTPATIENT_CLINIC_OR_DEPARTMENT_OTHER): Payer: Self-pay

## 2024-09-11 MED ORDER — AMPHETAMINE-DEXTROAMPHETAMINE 30 MG PO TABS
1.0000 | ORAL_TABLET | Freq: Two times a day (BID) | ORAL | 0 refills | Status: AC
  Filled 2024-09-11: qty 60, 30d supply, fill #0

## 2024-09-11 MED ORDER — AMPHETAMINE-DEXTROAMPHETAMINE 30 MG PO TABS
30.0000 mg | ORAL_TABLET | Freq: Two times a day (BID) | ORAL | 0 refills | Status: AC
  Filled 2024-10-10 – 2024-10-21 (×3): qty 60, 30d supply, fill #0

## 2024-10-10 ENCOUNTER — Other Ambulatory Visit (HOSPITAL_BASED_OUTPATIENT_CLINIC_OR_DEPARTMENT_OTHER): Payer: Self-pay

## 2024-10-11 ENCOUNTER — Other Ambulatory Visit (HOSPITAL_BASED_OUTPATIENT_CLINIC_OR_DEPARTMENT_OTHER): Payer: Self-pay

## 2024-10-11 ENCOUNTER — Other Ambulatory Visit: Payer: Self-pay

## 2024-10-21 ENCOUNTER — Other Ambulatory Visit (HOSPITAL_BASED_OUTPATIENT_CLINIC_OR_DEPARTMENT_OTHER): Payer: Self-pay
